# Patient Record
Sex: Female | Born: 1935 | Race: White | Hispanic: No | Smoking: Former smoker
Health system: Southern US, Community
[De-identification: ages and names within clinical notes are randomized; demographics above are authoritative.]

## PROBLEM LIST (undated history)

## (undated) ENCOUNTER — Emergency Department (HOSPITAL_COMMUNITY): Admission: EM | Payer: Medicare Other | Source: Home / Self Care

## (undated) DIAGNOSIS — I2510 Atherosclerotic heart disease of native coronary artery without angina pectoris: Secondary | ICD-10-CM

## (undated) DIAGNOSIS — R918 Other nonspecific abnormal finding of lung field: Secondary | ICD-10-CM

## (undated) DIAGNOSIS — D649 Anemia, unspecified: Secondary | ICD-10-CM

## (undated) DIAGNOSIS — I219 Acute myocardial infarction, unspecified: Secondary | ICD-10-CM

## (undated) DIAGNOSIS — I4891 Unspecified atrial fibrillation: Secondary | ICD-10-CM

## (undated) DIAGNOSIS — I10 Essential (primary) hypertension: Secondary | ICD-10-CM

## (undated) DIAGNOSIS — C3490 Malignant neoplasm of unspecified part of unspecified bronchus or lung: Secondary | ICD-10-CM

## (undated) DIAGNOSIS — J449 Chronic obstructive pulmonary disease, unspecified: Secondary | ICD-10-CM

## (undated) DIAGNOSIS — E039 Hypothyroidism, unspecified: Secondary | ICD-10-CM

## (undated) HISTORY — DX: Other nonspecific abnormal finding of lung field: R91.8

## (undated) HISTORY — DX: Atherosclerotic heart disease of native coronary artery without angina pectoris: I25.10

## (undated) HISTORY — PX: BACK SURGERY: SHX140

## (undated) HISTORY — PX: APPENDECTOMY: SHX54

## (undated) HISTORY — PX: OTHER SURGICAL HISTORY: SHX169

## (undated) HISTORY — PX: ABDOMINAL HYSTERECTOMY: SHX81

## (undated) HISTORY — DX: Malignant neoplasm of unspecified part of unspecified bronchus or lung: C34.90

## (undated) HISTORY — DX: Acute myocardial infarction, unspecified: I21.9

## (undated) HISTORY — DX: Unspecified atrial fibrillation: I48.91

## (undated) HISTORY — PX: CORONARY ANGIOPLASTY WITH STENT PLACEMENT: SHX49

## (undated) HISTORY — DX: Essential (primary) hypertension: I10

---

## 2006-01-04 ENCOUNTER — Observation Stay (HOSPITAL_COMMUNITY): Admission: EM | Admit: 2006-01-04 | Discharge: 2006-01-07 | Payer: Self-pay | Admitting: Emergency Medicine

## 2006-02-06 ENCOUNTER — Encounter (HOSPITAL_COMMUNITY): Admission: RE | Admit: 2006-02-06 | Discharge: 2006-03-08 | Payer: Self-pay | Admitting: Orthopedic Surgery

## 2006-03-13 ENCOUNTER — Encounter (HOSPITAL_COMMUNITY): Admission: RE | Admit: 2006-03-13 | Discharge: 2006-04-12 | Payer: Self-pay | Admitting: Orthopedic Surgery

## 2007-02-04 ENCOUNTER — Ambulatory Visit (HOSPITAL_COMMUNITY): Admission: RE | Admit: 2007-02-04 | Discharge: 2007-02-04 | Payer: Self-pay | Admitting: Family Medicine

## 2009-05-21 ENCOUNTER — Encounter: Payer: Self-pay | Admitting: Emergency Medicine

## 2009-05-21 ENCOUNTER — Emergency Department (HOSPITAL_COMMUNITY): Admission: EM | Admit: 2009-05-21 | Discharge: 2009-05-21 | Payer: Self-pay | Admitting: Emergency Medicine

## 2009-07-28 ENCOUNTER — Encounter (HOSPITAL_COMMUNITY): Admission: RE | Admit: 2009-07-28 | Discharge: 2009-08-27 | Payer: Self-pay | Admitting: Orthopedic Surgery

## 2009-09-09 HISTORY — PX: COLONOSCOPY: SHX174

## 2010-05-27 NOTE — Discharge Summary (Signed)
[name] [name], [name] [name]              ACCOUNT NO.:  [id number]   MEDICAL RECORD NO.:  [id number]          PATIENT TYPE:  OBV   LOCATION:  A340                          FACILITY:  [facility]   PHYSICIAN:  Marcello Moores, MD   DATE OF BIRTH:  September 25, 1927   DATE OF ADMISSION:  01/04/2006  DATE OF DISCHARGE:  12/30/2007LH                               DISCHARGE SUMMARY   PRIMARY CARE PHYSICIAN:  Dr. Marjean Donna   HISTORY OF PRESENT ILLNESS:  She is a 77 year old patient with history  of hypertension, hypothyroidism and high cholesterol, and osteoporosis.  She was admitted for multiple right-sided ribs fracture 8 11th with a  right arm fracture after a fall most probably old thanks giving day..  The patient was put on pain medication, pain management. The pain is  well improved and the patient wants to go home with pain medication and  physical therapy at home. A CT scan of the C-spine was done while she  was in the hospital which showed spondylosis and degenerative changes,  and small left upper lobe nodule with recommendation for a follow-up CT  scan after 6 months.   PHYSICAL EXAMINATION:  GENERAL: She is comfortable in bed. Her son is at  her bedside.  VITAL SIGNS: Within normal range. Blood pressure 137/70, pulse rate is  72, temperature is 98.4.  HEENT: She has pink conjunctivae and non icteric sclerae. Pupils are  equal and reactive.  CHEST: She has good air entry bilaterally, there are no abnormal sounds.  There is tenderness on the right side of the chest much improved from  that at time of admission.  CVS: S1, S2 regular, normal, no murmur.  ABDOMEN: Soft, bowel sounds positive. No organomegaly or tenderness.  EXTREMITIES: She has right arm tenderness but there is no edema.  CNS:  She is alert and well oriented.   LABORATORY DATA:  CBC is 11,000, hemoglobin is 11, hematocrit is 32, and  platelet count is 281,000. PT, INR is 13 and 1. Chemistries - blood  glucose is 103,  sodium is 132, potassium is 4, chloride is 99 and bicarb  is 29. BUN and creatinine 10 and 0.9. TSH level is 1.7.   CT scan of the C-spine reveals spondylosis and degenerative disk  changes, and a left upper lobe nodule.   ASSESSMENT AND PLAN:  1. Pain secondary to fracture is improving with pain medication.  2. Hypertension, stable on home medications.  3. Hypothyroidism, stable on home medications.  4. She needs to follow-up with her primary care physician in 2 weeks.  5. Physical therapy at home at least x3 a week for 2-3 weeks.   MEDICATIONS:  she will contuinue with her meds.  1.Hydrochlorothiazide 25 mg daily.  1. Metoprolol 25 mg b.i.d.  2. Synthroid 100 mcg p.o. daily.  3. Lipitor 20 mg p.o. daily.  4. Diovan 160 mg p.o. daily.  5. Fosamax 70 mg p.o. weekly.  7 percocet for pain  1. Primary care physician appointment in 2 weeks.  This was discussed with the patient and her son in detailand they are  aware  of fellowup imprtance.      Marcello Moores, MD  Electronically Signed     MT/MEDQ  D:  01/05/2006  T:  01/05/2006  Job:  [id number]   cc:   Marjean Donna  Fax: [phone]

## 2010-05-27 NOTE — Consult Note (Signed)
NAMEEVELIA, [name] [name]              ACCOUNT NO.:  [id number]   MEDICAL RECORD NO.:  [id number]          PATIENT TYPE:  INP   LOCATION:  A340                          FACILITY:  [facility]   PHYSICIAN:  J. Darreld Mclean, M.D. DATE OF BIRTH:  05/06/1927   DATE OF CONSULTATION:  01/04/2006  DATE OF DISCHARGE:                                 CONSULTATION   The patient fell on Thanksgiving Day and injured her right shoulder and  her side.  She was seen by Dr. Orson Slick in [place]. He was treating her  for a fracture of her right proximal humerus.  X-rays show multiple rib  fractures as well taken recently plus the healing fracture of the right  proximal humerus.  Yesterday she had severe pain.  She called her son  who came in to see her earlier this morning because of significant pain  in her back.  She denied a new fall.  Neurologically she was intact.  She has just been given some Dilaudid.  She is very drowsy.  Most of the  history is obtained from the son.   She has some pain trying to move her shoulder.  Neurovascular is intact.  Breathing is good.  Neurologically she is intact.   IMPRESSION:  1. Fractured right humerus approximately a month old.  2. Multiple rib fractures on the right, 8th through 19, probable old      from her fall a month ago.  3. Questionable compression fracture of the thoracolumbar spine.   I would like to get a total bone scan to rule out fracture and x-rays of  the thoracolumbar spine.  Bone scan can be tomorrow if I can get it  scheduled in time today for tomorrow.  Meanwhile, continue pain medicine  and bed rest.  We will follow.           ______________________________  Shela Commons. Darreld Mclean, M.D.     JWK/MEDQ  D:  01/04/2006  T:  01/04/2006  Job:  [id number]

## 2010-05-27 NOTE — H&P (Signed)
[name] [name], [name]              ACCOUNT NO.:  [id number]   MEDICAL RECORD NO.:  [id number]          PATIENT TYPE:  INP   LOCATION:  A340                          FACILITY:  [facility]   PHYSICIAN:  Marcello Moores, MD   DATE OF BIRTH:  Sep 25, 1927   DATE OF ADMISSION:  01/04/2006  DATE OF DISCHARGE:  LH                              HISTORY & PHYSICAL   PRIMARY MEDICAL DOCTOR:  Dr. Marjean Donna.   ORTHOPEDIST:  Dr. Arlean Hopping.   CHIEF COMPLAINT:  Pain on the right side of chest, one week's duration.   HISTORY OF PRESENT ILLNESS:  [name] [name] [name] is a 77 year old female patient  who had a history of hypertension, high cholesterol, hypothyroidism, and  osteoporosis.  She came to the ER with right-sided chest pain associated  with back pain for one week, nonradiating.  She gave a history of fall  and a right arm fracture on this year's Thanksgiving.  Since then, she  has pain on the same arm as well as on the chest, but it was worse over  the last week.  She was a independent woman, doing her daily activity  without any help until this Thanksgiving.  The last week she started to  have pain, which was worsened, and she was unable to do her daily  activity.  As for the son, there were no falls yesterday, except for  Thanksgiving day.   PAST MEDICAL HISTORY:  1. Hypertension, on medication.  2. High cholesterol, on medication.  3. Hypothyroidism, on Synthroid.  4. Osteoporosis, on medication.   SOCIAL HISTORY:  She is a widow since 28.  She has a history of  chronic smoking, one pack per day for several years.  Denied alcohol or  any illegal drugs.   FAMILY HISTORY:  Unremarkable.   REVIEW OF SYSTEMS:  The 10-point review of systems is unremarkable  except as noted in the HPI.   ALLERGIES:  There is no known drug allergies.   MEDICATIONS:  hydrochlorothiazide 25 mg daily  metoprolol 25 mg b.i.d.  Synthroid 100 mcg p.o. daily  Lipitor 20 mg p.o. daily  Diovan 150 mg p.o. daily  quinapril 40 mg p.o. b.i.d.  Fosamax 70 mg p.o. weekly.   PHYSICAL EXAMINATION:  GENERAL:  She is an elderly patient lying on the  bed without any respiratory distress.  VITAL SIGNS:  Blood pressure 106/48, temperature 98.6, pulse rate 60 per  minute.  She is saturating at 96% on room air.  She has pain assessment  of 5/10.  HEENT:  She has pink conjunctivae, nonicteric sclerae.  Pupils are equal  and reactive to light.  NECK:  Supple.  There is no lymphadenopathy detected.  CHEST:  Good air entry bilaterally.  There is tenderness on the right  side of the chest on palpation, but there is not any mass or swelling  detected.  CVS:  S1 and S2 well heard, regular.  There is no murmur or gallop.  ABDOMEN:  Full.  Soft.  Normoactive bowel sounds. no area of tenderness.  No mass or organomegaly appreciated.  GENITOURINARY:  No abnormality  is found.  SKIN:  Moist.  No abnormal lesions or discoloration is detected.  MUSCULOSKELETAL:  There is no tenderness or mass on the buttocks, but  there is tenderness on the right arm area.  EXTREMITIES:  There is no edema.  Palpable peripheral edema bilaterally.  CNS:  She is alert and oriented x3.  No focal deficit is appreciated.   There are no labs sent currently.  We will send it.   Chest x-ray showed multiple rib fracture on the right side from 8th to  11th.   ASSESSMENT/PLAN:  1. Multiple right-sided rib fractures, 8th to 11th, most probably old      ones related to Thanksgiving day fall.  Will give her pain      medication.  Will have physical therapy and will consult orthopedic      surgeon for advice.  2. Hypothyroidism:  She is on Synthroid and will send thyroid      stimulating hormone level and continue her synthroid.  3. Hypertension:  Well controlled with medication.  Will continue with      the same medication.  4. For high cholesterol, will continue with Lipitor and will send      fasting lipid profile tomorrow morning.  5. Will  put her on GI and DVT prophylaxis.  dicuused the issue with her son who is by the side of her and  supportive.      Marcello Moores, MD  Electronically Signed     MT/MEDQ  D:  01/04/2006  T:  01/04/2006  Job:  [id number]

## 2010-11-28 ENCOUNTER — Encounter (HOSPITAL_COMMUNITY): Admission: RE | Admit: 2010-11-28 | Payer: Medicare Other | Source: Ambulatory Visit

## 2010-11-29 ENCOUNTER — Encounter (HOSPITAL_COMMUNITY): Payer: Self-pay | Admitting: Pharmacy Technician

## 2010-12-05 ENCOUNTER — Other Ambulatory Visit: Payer: Self-pay

## 2010-12-05 ENCOUNTER — Encounter (HOSPITAL_COMMUNITY)
Admission: RE | Admit: 2010-12-05 | Discharge: 2010-12-05 | Disposition: A | Discharge status: Home / Self Care | Payer: Medicare Other | Source: Ambulatory Visit | Attending: Ophthalmology | Admitting: Ophthalmology

## 2010-12-05 ENCOUNTER — Encounter (HOSPITAL_COMMUNITY): Payer: Self-pay

## 2010-12-05 HISTORY — DX: Chronic obstructive pulmonary disease, unspecified: J44.9

## 2010-12-05 HISTORY — DX: Anemia, unspecified: D64.9

## 2010-12-05 HISTORY — DX: Hypothyroidism, unspecified: E03.9

## 2010-12-05 LAB — CBC
MCH: 31.4 pg (ref 26.0–34.0)
MCHC: 32.8 g/dL (ref 30.0–36.0)
Platelets: 253 10*3/uL (ref 150–400)
RBC: 3.34 MIL/uL — ABNORMAL LOW (ref 3.87–5.11)

## 2010-12-05 LAB — BASIC METABOLIC PANEL
BUN: 18 mg/dL (ref 6–23)
GFR calc Af Amer: 57 mL/min — ABNORMAL LOW (ref >90)
Potassium: 4.2 mEq/L (ref 3.5–5.1)

## 2010-12-05 NOTE — Patient Instructions (Addendum)
22 [name] [name]  12/05/2010   Your procedure is scheduled on:   12/06/2010  Report to [facility] at  900  AM.  Call this number if you have problems the morning of surgery: [phone]   Remember:   Do not eat food:After Midnight.  May have clear liquids:until Midnight .  Clear liquids include soda, tea, black coffee, apple or grape juice, broth.  Take these medicines the morning of surgery with A SIP OF WATER:  Levothyroxine,lopressor,benicar. Take spiriva before you come.   Do not wear jewelry, make-up or nail polish.  Do not wear lotions, powders, or perfumes. You may wear deodorant.  Do not shave 48 hours prior to surgery.  Do not bring valuables to the hospital.  Contacts, dentures or bridgework may not be worn into surgery.  Leave suitcase in the car. After surgery it may be brought to your room.  For patients admitted to the hospital, checkout time is 11:00 AM the day of discharge.   Patients discharged the day of surgery will not be allowed to drive home.  Name and phone number of your driver: family  Special Instructions: N/A   Please read over the following fact sheets that you were given: Pain Booklet, Surgical Site Infection Prevention, Anesthesia Post-op Instructions and Care and Recovery After Surgery Cataract A cataract is a clouding of the lens of the eye. It is most often related to aging. A cataract is not a "film" over the surface of the eye. The lens is inside the eye and changes size of the pupil. The lens can enlarge to let more light enter the eye in dark environments and contract the size of the pupil to let in bright light. The lens is the part of the eye that helps focus light on the retina. The retina is the eye's light-sensitive layer. It is in the back of the eye that sends visual signals to the brain. In a normal eye, light passes through the lens and gets focused on the retina. To help produce a sharp image, the lens must remain clear. When a lens becomes  cloudy, vision is compromised by the degree and nature of the clouding. Certain cataracts make people more near-sighted as they develop, others increase glare, and all reduce vision to some degree or another. A cataract that is so dense that it becomes milky white and a white opacity can be seen through the pupil. When the white color is seen, it is called a "mature" or "hyper-mature cataract." Such cataracts cause total blindness in the affected eye. The cataract must be removed to prevent damage to the eye itself. Some types of cataracts can cause a secondary disease of the eye, such as certain types of glaucoma. In the early stages, better lighting and eyeglasses may lessen vision problems caused by cataracts. At a certain point, surgery may be needed to improve vision. CAUSES   Aging. However, cataracts may occur at any age, even in newborns.   Certain drugs.   Trauma to the eye.   Certain diseases (such as diabetes).   Inherited or acquired medical syndromes.  SYMPTOMS   Gradual, progressive drop in vision in the affected eye. Cataracts may develop at different rates in each eye. Cataracts may even be in just one eye with the other unaffected.   Cataracts due to trauma may develop quickly, sometimes over a matter or days or even hours. The result is severe and rapid visual loss.  DIAGNOSIS  To detect a  cataract, an eye doctor examines the lens. A well developed cataract can be diagnosed without dilating the pupil. Early cataracts and others of a specific nature are best diagnosed with an exam of the eyes with the pupils dilated by drops. TREATMENT   For an early cataract, vision may improve by using different eyeglasses or stronger lighting.   If the above measures do not help, surgery is the only effective treatment. This treatment removes the cloudy lens and replaces it with a substitute lens (Intraocular lens, or IOL). Newly developed IOL technology allows the implanted lens to  improve vision both at a distance and up close. Discuss with your eye surgeon about the possibility of still needing glasses. Also discuss how visual coordination between both eyes will be affected.  A cataract needs to be removed only when vision loss interferes with your everyday activities such as driving, reading or watching TV. You and your eye doctor can make that decision together. In most cases, waiting until you are ready to have cataract surgery will not harm your eye. If you have cataracts in both eyes, only one should be removed at a time. This allows the operated eye to heal and be out of danger from serious problems (such as infection or poor wound healing) before having the other eye undergo surgery.  Sometimes, a cataract should be removed even if it does not cause problems with your vision. For example, a cataract should be removed if it prevents examination or treatment of another eye problem. Just as you cannot see out of the affected eye well, your doctor cannot see into your eye well through a cataract. The vast majority of people who have cataract surgery have better vision afterward. CATARACT REMOVAL There are two primary ways to remove a cataract. Your doctor can explain the differences and help determine which is best for you:  Phacoemulsification (small incision cataract surgery). This involves making a small cut (incision) on the edge of the clear, dome-shaped surface that covers the front of the eye (the cornea). An injection behind the eye or eye drops are given to make this a painless procedure. The doctor then inserts a tiny probe into the eye. This device emits ultrasound waves that soften and break up the cloudy center of the lens so it can be removed by suction. Most cataract surgery is done this way. The cuts are usually so small and performed in such a manner that often no sutures are needed to keep it closed.   Extracapsular surgery. Your doctor makes a slightly longer  incision on the side of the cornea. The doctor removes the hard center of the lens. The remainder of the lens is then removed by suction. In some cases, extremely fine sutures are needed which the doctor may, or may not remove in the office after the surgery.  When an IOL is implanted, it needs no care. It becomes a permanent part of your eye and cannot be seen or felt.  Some people cannot have an IOL. They may have problems during surgery, or maybe they have another eye disease. For these people, a soft contact lens may be suggested. If an IOL or contact lens cannot be used, very powerful and thick glasses are required after surgery. Since vision is very different through such thick glasses, it is important to have your doctor discuss the impact on your vision after any cataract surgery where there is no plan to implant an IOL. The normal lens of the eye  is covered by a clear capsule. Both phacoemulsification and extracapsular surgery require that the back surface of this lens capsule be left in place. This helps support IOLs and prevents the IOL from dislocating and falling back into the deeper interior of the eye. Right after surgery, and often permanently this "posterior capsule" remains clear. In some cases however, it can become cloudy, presenting the same type of visual compromise that the original cataract did since light is again obstructed as it passes through the clear IOL. This condition is often referred to as an "after-cataract." Fortunately, after-cataracts are easily treated using a painless and very fast laser treatment that is performed without anesthesia or incisions. It is done in a matter of minutes in an outpatient environment. Visual improvement is often immediate.  HOME CARE INSTRUCTIONS   Your surgeon will discuss pre and post operative care with you prior to surgery. The majority of people are able to do almost all normal activities right away. Although, it is often advised to avoid  strenuous activity for a period of time.   Postoperative drops and careful avoidance of infection will be needed. Many surgeons suggest the use of a protective shield during the first few days after surgery.   There is a very small incidence of complication from modern cataract surgery, but it can happen. Infection that spreads to the inside of the eye (endophthalmitis) can result in total visual loss and even loss of the eye itself. In extremely rare instances, the inflammation of endophthalmitis can spread to both eyes (sympathetic ophthalmia). Appropriate post-operative care under the close observation of your surgeon is essential to a successful outcome.  SEEK IMMEDIATE MEDICAL CARE IF:   You have any sudden drop of vision in the operated eye.   You have pain in the operated eye.   You see a large number of floating dots in the field of vision in the operated eye.   You see flashing lights, or if a portion of your side vision in any direction appears black (like a curtain being drawn into your field of vision) in the operated eye.  Document Released: 12/26/2004 Document Revised: 09/07/2010 Document Reviewed: 02/11/2007 [facility] Patient Information 2012 [place], [place].PATIENT INSTRUCTIONS POST-ANESTHESIA  IMMEDIATELY FOLLOWING SURGERY:  Do not drive or operate machinery for the first twenty four hours after surgery.  Do not make any important decisions for twenty four hours after surgery or while taking narcotic pain medications or sedatives.  If you develop intractable nausea and vomiting or a severe headache please notify your doctor immediately.  FOLLOW-UP:  Please make an appointment with your surgeon as instructed. You do not need to follow up with anesthesia unless specifically instructed to do so.  WOUND CARE INSTRUCTIONS (if applicable):  Keep a dry clean dressing on the anesthesia/puncture wound site if there is drainage.  Once the wound has quit draining you may leave it open to  air.  Generally you should leave the bandage intact for twenty four hours unless there is drainage.  If the epidural site drains for more than 36-48 hours please call the anesthesia department.  QUESTIONS?:  Please feel free to call your physician or the hospital operator if you have any questions, and they will be happy to assist you.     [facility] Anesthesia Department [address] [place] [phone]

## 2010-12-06 ENCOUNTER — Encounter (HOSPITAL_COMMUNITY): Payer: Self-pay | Admitting: Anesthesiology

## 2010-12-06 ENCOUNTER — Encounter (HOSPITAL_COMMUNITY): Payer: Self-pay | Admitting: *Deleted

## 2010-12-06 ENCOUNTER — Ambulatory Visit (HOSPITAL_COMMUNITY)
Admission: RE | Admit: 2010-12-06 | Discharge: 2010-12-06 | Disposition: A | Discharge status: Home / Self Care | Payer: Medicare Other | Source: Ambulatory Visit | Attending: Ophthalmology | Admitting: Ophthalmology

## 2010-12-06 ENCOUNTER — Encounter (HOSPITAL_COMMUNITY)
Admission: RE | Disposition: A | Discharge status: Home / Self Care | Payer: Self-pay | Source: Ambulatory Visit | Attending: Ophthalmology

## 2010-12-06 ENCOUNTER — Ambulatory Visit (HOSPITAL_COMMUNITY): Payer: Medicare Other | Admitting: Anesthesiology

## 2010-12-06 DIAGNOSIS — J4489 Other specified chronic obstructive pulmonary disease: Secondary | ICD-10-CM | POA: Insufficient documentation

## 2010-12-06 DIAGNOSIS — Z79899 Other long term (current) drug therapy: Secondary | ICD-10-CM | POA: Insufficient documentation

## 2010-12-06 DIAGNOSIS — H2510 Age-related nuclear cataract, unspecified eye: Secondary | ICD-10-CM | POA: Insufficient documentation

## 2010-12-06 DIAGNOSIS — Z01810 Encounter for preprocedural cardiovascular examination: Secondary | ICD-10-CM | POA: Insufficient documentation

## 2010-12-06 DIAGNOSIS — I10 Essential (primary) hypertension: Secondary | ICD-10-CM | POA: Insufficient documentation

## 2010-12-06 DIAGNOSIS — J449 Chronic obstructive pulmonary disease, unspecified: Secondary | ICD-10-CM | POA: Insufficient documentation

## 2010-12-06 DIAGNOSIS — Z01812 Encounter for preprocedural laboratory examination: Secondary | ICD-10-CM | POA: Insufficient documentation

## 2010-12-06 HISTORY — PX: CATARACT EXTRACTION W/PHACO: SHX586

## 2010-12-06 SURGERY — PHACOEMULSIFICATION, CATARACT, WITH IOL INSERTION
Anesthesia: Monitor Anesthesia Care | Site: Eye | Laterality: Left | Wound class: Clean

## 2010-12-06 MED ORDER — PROVISC 10 MG/ML IO SOLN
INTRAOCULAR | Status: DC | PRN
  Administered 2010-12-06: 8.5 mg via INTRAOCULAR

## 2010-12-06 MED ORDER — MIDAZOLAM HCL 2 MG/2ML IJ SOLN
1.0000 mg | INTRAMUSCULAR | Status: DC | PRN
  Administered 2010-12-06: 2 mg via INTRAVENOUS

## 2010-12-06 MED ORDER — PHENYLEPHRINE HCL 2.5 % OP SOLN
OPHTHALMIC | Status: AC
  Administered 2010-12-06: 1 [drp] via OPHTHALMIC
  Filled 2010-12-06: qty 2

## 2010-12-06 MED ORDER — ONDANSETRON HCL 4 MG/2ML IJ SOLN: 4.0000 mg | Freq: Once | INTRAMUSCULAR | Status: DC | PRN

## 2010-12-06 MED ORDER — EPINEPHRINE HCL 1 MG/ML IJ SOLN
INTRAOCULAR | Status: DC | PRN
  Administered 2010-12-06: 10:00:00

## 2010-12-06 MED ORDER — CYCLOPENTOLATE-PHENYLEPHRINE 0.2-1 % OP SOLN
1.0000 [drp] | OPHTHALMIC | Status: AC
  Administered 2010-12-06 (×3): 1 [drp] via OPHTHALMIC

## 2010-12-06 MED ORDER — CYCLOPENTOLATE-PHENYLEPHRINE 0.2-1 % OP SOLN
OPHTHALMIC | Status: AC
  Administered 2010-12-06: 1 [drp] via OPHTHALMIC
  Filled 2010-12-06: qty 2

## 2010-12-06 MED ORDER — TETRACAINE HCL 0.5 % OP SOLN
1.0000 [drp] | OPHTHALMIC | Status: AC
  Administered 2010-12-06 (×3): 1 [drp] via OPHTHALMIC

## 2010-12-06 MED ORDER — PHENYLEPHRINE HCL 2.5 % OP SOLN
1.0000 [drp] | OPHTHALMIC | Status: AC
  Administered 2010-12-06 (×3): 1 [drp] via OPHTHALMIC

## 2010-12-06 MED ORDER — TETRACAINE HCL 0.5 % OP SOLN
OPHTHALMIC | Status: AC
  Administered 2010-12-06: 1 [drp] via OPHTHALMIC
  Filled 2010-12-06: qty 2

## 2010-12-06 MED ORDER — MIDAZOLAM HCL 2 MG/2ML IJ SOLN
INTRAMUSCULAR | Status: AC
  Administered 2010-12-06: 2 mg via INTRAVENOUS
  Filled 2010-12-06: qty 2

## 2010-12-06 MED ORDER — FLURBIPROFEN SODIUM 0.03 % OP SOLN
1.0000 [drp] | OPHTHALMIC | Status: AC
  Administered 2010-12-06 (×3): 1 [drp] via OPHTHALMIC

## 2010-12-06 MED ORDER — BSS IO SOLN
INTRAOCULAR | Status: DC | PRN
  Administered 2010-12-06: 15 mL via INTRAOCULAR

## 2010-12-06 MED ORDER — FENTANYL CITRATE 0.05 MG/ML IJ SOLN: 25.0000 ug | INTRAMUSCULAR | Status: DC | PRN

## 2010-12-06 MED ORDER — LACTATED RINGERS IV SOLN
INTRAVENOUS | Status: DC
  Administered 2010-12-06: 1000 mL via INTRAVENOUS

## 2010-12-06 MED ORDER — FLURBIPROFEN SODIUM 0.03 % OP SOLN
OPHTHALMIC | Status: AC
  Administered 2010-12-06: 1 [drp] via OPHTHALMIC
  Filled 2010-12-06: qty 2.5

## 2010-12-06 SURGICAL SUPPLY — 21 items
CAPSULAR TENSION RING-AMO (OPHTHALMIC RELATED) IMPLANT
CLOTH BEACON ORANGE TIMEOUT ST (SAFETY) ×2 IMPLANT
EYE SHIELD UNIVERSAL CLEAR (GAUZE/BANDAGES/DRESSINGS) ×2 IMPLANT
GLOVE BIOGEL M 6.5 STRL (GLOVE) ×2 IMPLANT
GLOVE ECLIPSE 6.5 STRL STRAW (GLOVE) IMPLANT
GLOVE ECLIPSE 7.0 STRL STRAW (GLOVE) IMPLANT
GLOVE EXAM NITRILE LRG STRL (GLOVE) ×2 IMPLANT
GLOVE EXAM NITRILE MD LF STRL (GLOVE) IMPLANT
GLOVE SKINSENSE NS SZ6.5 (GLOVE)
GLOVE SKINSENSE STRL SZ6.5 (GLOVE) IMPLANT
HEALON 5 0.6 ML (INTRAOCULAR LENS) IMPLANT
KIT VITRECTOMY (OPHTHALMIC RELATED) IMPLANT
PAD ARMBOARD 7.5X6 YLW CONV (MISCELLANEOUS) ×2 IMPLANT
PROC W NO LENS (INTRAOCULAR LENS)
PROC W SPEC LENS (INTRAOCULAR LENS)
PROCESS W NO LENS (INTRAOCULAR LENS) IMPLANT
PROCESS W SPEC LENS (INTRAOCULAR LENS) IMPLANT
RING MALYGIN (MISCELLANEOUS) IMPLANT
SIGHTPATH CAT PROC W REG LENS (Ophthalmic Related) ×2 IMPLANT
VISCOELASTIC ADDITIONAL (OPHTHALMIC RELATED) IMPLANT
WATER STERILE IRR 250ML POUR (IV SOLUTION) ×2 IMPLANT

## 2010-12-06 NOTE — Op Note (Signed)
Patient brought to the operating room and prepped and draped in the usual manner.  Lid speculum inserted in right eye.  Stab incision made at the twelve o'clock position.  Provisc instilled in the anterior chamber.   A 2.4 mm. Stab incision was made temporally.  An anterior capsulotomy was done with a bent 25 gauge needle.  The nucleus was hydrodissected.  The Phaco tip was inserted in the anterior chamber and the nucleus was emulsified.  CDE was 10.81.  The cortical material was then removed with the I and A tip.  Posterior capsule was the polished.  The anterior chamber was deepened with Provisc.  A 22.0 Diopter Rayner 570C IOL was then inserted in the capsular bag.  Provisc was then removed with the I and A tip.  The wound was then hydrated.  Patient sent to the Recovery Room in good condition with follow up in my office.

## 2010-12-06 NOTE — Transfer of Care (Signed)
Immediate Anesthesia Transfer of Care Note  Patient: [name] [name]  Procedure(s) Performed:  CATARACT EXTRACTION PHACO AND INTRAOCULAR LENS PLACEMENT (IOC) - CDE:10.81  Patient Location: PACU and Short Stay  Anesthesia Type: MAC  Level of Consciousness: awake, alert , oriented and patient cooperative  Airway & Oxygen Therapy: Patient Spontanous Breathing  Post-op Assessment: Report given to PACU RN, Post -op Vital signs reviewed and stable and Patient moving all extremities  Post vital signs: Reviewed and stable  Complications: No apparent anesthesia complications

## 2010-12-06 NOTE — Anesthesia Preprocedure Evaluation (Addendum)
Anesthesia Evaluation  Patient identified by MRN, date of birth, ID band Patient awake    Reviewed: Allergy & Precautions, H&P , NPO status , Patient's Chart, lab work & pertinent test results, reviewed documented beta blocker date and time   History of Anesthesia Complications Negative for: history of anesthetic complications  Airway Mallampati: III    Mouth opening: Limited Mouth Opening  Dental  (+) Edentulous Upper and Edentulous Lower   Pulmonary shortness of breath and with exertion, COPD (Lung Cancer - R Mid Lobectomy)   Pulmonary exam normal       Cardiovascular hypertension, + Past MI and + Cardiac Stents Regular Normal    Neuro/Psych    GI/Hepatic   Endo/Other    Renal/GU      Musculoskeletal   Abdominal   Peds  Hematology   Anesthesia Other Findings   Reproductive/Obstetrics                           Anesthesia Physical Anesthesia Plan  ASA: III  Anesthesia Plan: MAC   Post-op Pain Management:    Induction: Intravenous  Airway Management Planned: Nasal Cannula  Additional Equipment:   Intra-op Plan:   Post-operative Plan:   Informed Consent: I have reviewed the patients History and Physical, chart, labs and discussed the procedure including the risks, benefits and alternatives for the proposed anesthesia with the patient or authorized representative who has indicated his/her understanding and acceptance.     Plan Discussed with:   Anesthesia Plan Comments:        Anesthesia Quick Evaluation

## 2010-12-06 NOTE — Anesthesia Postprocedure Evaluation (Signed)
  Anesthesia Post-op Note  Patient: [name] [name]  Procedure(s) Performed:  CATARACT EXTRACTION PHACO AND INTRAOCULAR LENS PLACEMENT (IOC) - CDE:10.81  Patient Location: PACU and Short Stay  Anesthesia Type: MAC  Level of Consciousness: awake, alert , oriented and patient cooperative  Airway and Oxygen Therapy: Patient Spontanous Breathing  Post-op Pain: none  Post-op Assessment: Post-op Vital signs reviewed, Patient's Cardiovascular Status Stable, Respiratory Function Stable and Patent Airway  Post-op Vital Signs: Reviewed and stable  Complications: No apparent anesthesia complications

## 2010-12-06 NOTE — Discharge Instructions (Signed)
ERCEL NORMOYLE  12/06/2010           [facility] Instructions [address] [place] [address]      1. Avoid closing eyes tightly. One often closes the eye tightly when laughing, talking, sneezing, coughing or if they feel irritated. At these times, you should be careful not to close your eyes tightly.  2. Instill one drop [place] in operative eye 3 times each day until the bottle is finished. To instill drops in your eye, open it, look up and have someone gently pull the lower lid down and instill a couple of drops inside the lower lid.  3. Do not touch upper lid.  4. Take Advil or Tylenol for pain.  5. You may use either eye for near work, such as reading or sewing and you may watch television.  6. You may have your hair done at the beauty parlor at any time.  7. Wear dark glasses with or without your own glasses if you are in bright light.  8. Call our office at [phone] or [phone] if you have sharp pain in your eye or unusual symptoms.  9. Do not be concerned because vision in the operative eye is not good. It will not be good, no matter how successful the operation, until you get a special lens for it. Your old glasses will not be suited to the new eye that was operated on and you will not be ready for a new lens for about a month.  10. Follow up at the [facility] office.    I have received a copy of the above instructions and will follow them.

## 2010-12-06 NOTE — H&P (Signed)
The patient was re examined and there is no change in the patients condition since the original H and P. 

## 2010-12-13 ENCOUNTER — Encounter (HOSPITAL_COMMUNITY): Payer: Self-pay | Admitting: Ophthalmology

## 2010-12-14 ENCOUNTER — Encounter (HOSPITAL_COMMUNITY): Payer: Self-pay

## 2010-12-15 ENCOUNTER — Encounter (HOSPITAL_COMMUNITY)
Admission: RE | Admit: 2010-12-15 | Discharge: 2010-12-15 | Payer: Medicare Other | Source: Ambulatory Visit | Admitting: Ophthalmology

## 2010-12-20 ENCOUNTER — Encounter (HOSPITAL_COMMUNITY)
Admission: RE | Disposition: A | Discharge status: Home / Self Care | Payer: Self-pay | Source: Ambulatory Visit | Attending: Ophthalmology

## 2010-12-20 ENCOUNTER — Encounter (HOSPITAL_COMMUNITY): Payer: Self-pay | Admitting: Anesthesiology

## 2010-12-20 ENCOUNTER — Encounter (HOSPITAL_COMMUNITY): Payer: Self-pay

## 2010-12-20 ENCOUNTER — Ambulatory Visit (HOSPITAL_COMMUNITY)
Admission: RE | Admit: 2010-12-20 | Discharge: 2010-12-20 | Disposition: A | Discharge status: Home / Self Care | Payer: Medicare Other | Source: Ambulatory Visit | Attending: Ophthalmology | Admitting: Ophthalmology

## 2010-12-20 ENCOUNTER — Ambulatory Visit (HOSPITAL_COMMUNITY): Payer: Medicare Other | Admitting: Anesthesiology

## 2010-12-20 DIAGNOSIS — H2510 Age-related nuclear cataract, unspecified eye: Secondary | ICD-10-CM | POA: Insufficient documentation

## 2010-12-20 DIAGNOSIS — J4489 Other specified chronic obstructive pulmonary disease: Secondary | ICD-10-CM | POA: Insufficient documentation

## 2010-12-20 DIAGNOSIS — Z79899 Other long term (current) drug therapy: Secondary | ICD-10-CM | POA: Insufficient documentation

## 2010-12-20 DIAGNOSIS — I10 Essential (primary) hypertension: Secondary | ICD-10-CM | POA: Insufficient documentation

## 2010-12-20 DIAGNOSIS — Z7982 Long term (current) use of aspirin: Secondary | ICD-10-CM | POA: Insufficient documentation

## 2010-12-20 DIAGNOSIS — J449 Chronic obstructive pulmonary disease, unspecified: Secondary | ICD-10-CM | POA: Insufficient documentation

## 2010-12-20 HISTORY — PX: CATARACT EXTRACTION W/PHACO: SHX586

## 2010-12-20 SURGERY — PHACOEMULSIFICATION, CATARACT, WITH IOL INSERTION
Anesthesia: Monitor Anesthesia Care | Site: Eye | Laterality: Right | Wound class: Clean

## 2010-12-20 MED ORDER — EPINEPHRINE HCL 1 MG/ML IJ SOLN
INTRAOCULAR | Status: DC | PRN
  Administered 2010-12-20: 08:00:00

## 2010-12-20 MED ORDER — FLURBIPROFEN SODIUM 0.03 % OP SOLN
OPHTHALMIC | Status: AC
  Administered 2010-12-20: 1 [drp] via OPHTHALMIC
  Filled 2010-12-20: qty 2.5

## 2010-12-20 MED ORDER — BSS IO SOLN
INTRAOCULAR | Status: DC | PRN
  Administered 2010-12-20: 15 mL via INTRAOCULAR

## 2010-12-20 MED ORDER — TETRACAINE HCL 0.5 % OP SOLN
OPHTHALMIC | Status: AC
  Administered 2010-12-20: 1 [drp] via OPHTHALMIC
  Filled 2010-12-20: qty 2

## 2010-12-20 MED ORDER — PHENYLEPHRINE HCL 2.5 % OP SOLN
1.0000 [drp] | OPHTHALMIC | Status: AC
  Administered 2010-12-20 (×3): 1 [drp] via OPHTHALMIC

## 2010-12-20 MED ORDER — TETRACAINE HCL 0.5 % OP SOLN
1.0000 [drp] | OPHTHALMIC | Status: AC
  Administered 2010-12-20 (×3): 1 [drp] via OPHTHALMIC

## 2010-12-20 MED ORDER — PHENYLEPHRINE HCL 2.5 % OP SOLN
OPHTHALMIC | Status: AC
  Administered 2010-12-20: 1 [drp] via OPHTHALMIC
  Filled 2010-12-20: qty 2

## 2010-12-20 MED ORDER — LACTATED RINGERS IV SOLN
INTRAVENOUS | Status: DC
  Administered 2010-12-20: 07:00:00 via INTRAVENOUS

## 2010-12-20 MED ORDER — MIDAZOLAM HCL 2 MG/2ML IJ SOLN
INTRAMUSCULAR | Status: AC
  Administered 2010-12-20: 2 mg via INTRAVENOUS
  Filled 2010-12-20: qty 2

## 2010-12-20 MED ORDER — KETOROLAC TROMETHAMINE 0.5 % OP SOLN
1.0000 [drp] | OPHTHALMIC | Status: AC
  Administered 2010-12-20 (×3): 1 [drp] via OPHTHALMIC

## 2010-12-20 MED ORDER — CYCLOPENTOLATE-PHENYLEPHRINE 0.2-1 % OP SOLN
1.0000 [drp] | OPHTHALMIC | Status: AC
  Administered 2010-12-20 (×3): 1 [drp] via OPHTHALMIC

## 2010-12-20 MED ORDER — FLURBIPROFEN SODIUM 0.03 % OP SOLN
1.0000 [drp] | OPHTHALMIC | Status: AC
  Administered 2010-12-20 (×3): 1 [drp] via OPHTHALMIC

## 2010-12-20 MED ORDER — MIDAZOLAM HCL 2 MG/2ML IJ SOLN
1.0000 mg | INTRAMUSCULAR | Status: DC | PRN
  Administered 2010-12-20: 2 mg via INTRAVENOUS

## 2010-12-20 MED ORDER — CYCLOPENTOLATE-PHENYLEPHRINE 0.2-1 % OP SOLN
OPHTHALMIC | Status: AC
  Administered 2010-12-20: 1 [drp] via OPHTHALMIC
  Filled 2010-12-20: qty 2

## 2010-12-20 MED ORDER — EPINEPHRINE HCL 1 MG/ML IJ SOLN
INTRAMUSCULAR | Status: AC
  Filled 2010-12-20: qty 1

## 2010-12-20 MED ORDER — PROVISC 10 MG/ML IO SOLN
INTRAOCULAR | Status: DC | PRN
  Administered 2010-12-20: 8.5 mg via INTRAOCULAR

## 2010-12-20 MED ORDER — KETOROLAC TROMETHAMINE 0.5 % OP SOLN
OPHTHALMIC | Status: AC
  Administered 2010-12-20: 1 [drp] via OPHTHALMIC
  Filled 2010-12-20: qty 5

## 2010-12-20 SURGICAL SUPPLY — 21 items
CAPSULAR TENSION RING-AMO (OPHTHALMIC RELATED) IMPLANT
CLOTH BEACON ORANGE TIMEOUT ST (SAFETY) ×2 IMPLANT
EYE SHIELD UNIVERSAL CLEAR (GAUZE/BANDAGES/DRESSINGS) ×2 IMPLANT
GLOVE BIOGEL M 6.5 STRL (GLOVE) IMPLANT
GLOVE ECLIPSE 6.5 STRL STRAW (GLOVE) IMPLANT
GLOVE ECLIPSE 7.0 STRL STRAW (GLOVE) IMPLANT
GLOVE EXAM NITRILE LRG STRL (GLOVE) ×2 IMPLANT
GLOVE EXAM NITRILE MD LF STRL (GLOVE) IMPLANT
GLOVE SKINSENSE NS SZ6.5 (GLOVE)
GLOVE SKINSENSE STRL SZ6.5 (GLOVE) IMPLANT
HEALON 5 0.6 ML (INTRAOCULAR LENS) IMPLANT
KIT VITRECTOMY (OPHTHALMIC RELATED) IMPLANT
PAD ARMBOARD 7.5X6 YLW CONV (MISCELLANEOUS) ×2 IMPLANT
PROC W NO LENS (INTRAOCULAR LENS)
PROC W SPEC LENS (INTRAOCULAR LENS)
PROCESS W NO LENS (INTRAOCULAR LENS) IMPLANT
PROCESS W SPEC LENS (INTRAOCULAR LENS) IMPLANT
RING MALYGIN (MISCELLANEOUS) IMPLANT
SIGHTPATH CAT PROC W REG LENS (Ophthalmic Related) ×2 IMPLANT
VISCOELASTIC ADDITIONAL (OPHTHALMIC RELATED) IMPLANT
WATER STERILE IRR 250ML POUR (IV SOLUTION) ×2 IMPLANT

## 2010-12-20 NOTE — Transfer of Care (Signed)
Immediate Anesthesia Transfer of Care Note  Patient: [name] [name]  Procedure(s) Performed:  CATARACT EXTRACTION PHACO AND INTRAOCULAR LENS PLACEMENT (IOC) - CDI:7.94  Patient Location: Shortstay  Anesthesia Type: MAC  Level of Consciousness: awake  Airway & Oxygen Therapy: Patient Spontanous Breathing   Post-op Assessment: Report given to PACU RN, Post -op Vital signs reviewed and stable and Patient moving all extremities  Post vital signs: Reviewed and stable  Complications: No apparent anesthesia complications

## 2010-12-20 NOTE — Anesthesia Procedure Notes (Signed)
Procedure Name: MAC Date/Time: 12/20/2010 7:25 AM Performed by: Minerva Areola Pre-anesthesia Checklist: Patient identified, Patient being monitored, Emergency Drugs available, Timeout performed and Suction available Patient Re-evaluated:Patient Re-evaluated prior to inductionOxygen Delivery Method: Nasal Cannula

## 2010-12-20 NOTE — Anesthesia Postprocedure Evaluation (Signed)
  Anesthesia Post-op Note  Patient: [name] [name]  Procedure(s) Performed:  CATARACT EXTRACTION PHACO AND INTRAOCULAR LENS PLACEMENT (IOC) - CDI:7.94  Patient Location:  Short Stay  Anesthesia Type: MAC  Level of Consciousness: awake  Airway and Oxygen Therapy: Patient Spontanous Breathing  Post-op Pain: none  Post-op Assessment: Post-op Vital signs reviewed, Patient's Cardiovascular Status Stable, Respiratory Function Stable, Patent Airway, No signs of Nausea or vomiting and Pain level controlled  Post-op Vital Signs: Reviewed and stable  Complications: No apparent anesthesia complications

## 2010-12-20 NOTE — Anesthesia Preprocedure Evaluation (Signed)
Anesthesia Evaluation  Patient identified by MRN, date of birth, ID band Patient awake    Reviewed: Allergy & Precautions, H&P , NPO status , Patient's Chart, lab work & pertinent test results, reviewed documented beta blocker date and time   History of Anesthesia Complications Negative for: history of anesthetic complications  Airway Mallampati: III    Mouth opening: Limited Mouth Opening  Dental  (+) Edentulous Upper and Edentulous Lower   Pulmonary shortness of breath and with exertion, COPD (Lung Cancer - R Mid Lobectomy)   Pulmonary exam normal       Cardiovascular hypertension, + Past MI and + Cardiac Stents Regular Normal    Neuro/Psych    GI/Hepatic   Endo/Other    Renal/GU      Musculoskeletal   Abdominal   Peds  Hematology   Anesthesia Other Findings   Reproductive/Obstetrics                           Anesthesia Physical Anesthesia Plan  ASA: III  Anesthesia Plan: MAC   Post-op Pain Management:    Induction: Intravenous  Airway Management Planned: Nasal Cannula  Additional Equipment:   Intra-op Plan:   Post-operative Plan:   Informed Consent: I have reviewed the patients History and Physical, chart, labs and discussed the procedure including the risks, benefits and alternatives for the proposed anesthesia with the patient or authorized representative who has indicated his/her understanding and acceptance.     Plan Discussed with:   Anesthesia Plan Comments:        Anesthesia Quick Evaluation  

## 2010-12-20 NOTE — Op Note (Signed)
Patient brought to the operating room and prepped and draped in the usual manner.  Lid speculum inserted in right eye.  Stab incision made at the twelve o'clock position.  Provisc instilled in the anterior chamber.   A 2.4 mm. Stab incision was made temporally.  An anterior capsulotomy was done with a bent 25 gauge needle.  The nucleus was hydrodissected.  The Phaco tip was inserted in the anterior chamber and the nucleus was emulsified.  CDE was 7.94.  The cortical material was then removed with the I and A tip.  Posterior capsule was the polished.  The anterior chamber was deepened with Provisc.  A 23.5 Diopter Rayner 570C IOL was then inserted in the capsular bag.  Provisc was then removed with the I and A tip.  The wound was then hydrated.  Patient sent to the Recovery Room in good condition with follow up in my office.

## 2010-12-20 NOTE — H&P (Signed)
The patient was re examined and there is no change in the patients condition since the original H and P. 

## 2010-12-26 ENCOUNTER — Encounter (HOSPITAL_COMMUNITY): Payer: Self-pay | Admitting: Ophthalmology

## 2010-12-29 ENCOUNTER — Encounter (INDEPENDENT_AMBULATORY_CARE_PROVIDER_SITE_OTHER): Payer: Medicare Other | Admitting: Ophthalmology

## 2010-12-29 DIAGNOSIS — H43819 Vitreous degeneration, unspecified eye: Secondary | ICD-10-CM

## 2010-12-29 DIAGNOSIS — H35379 Puckering of macula, unspecified eye: Secondary | ICD-10-CM

## 2010-12-29 DIAGNOSIS — H35359 Cystoid macular degeneration, unspecified eye: Secondary | ICD-10-CM

## 2010-12-29 DIAGNOSIS — H35039 Hypertensive retinopathy, unspecified eye: Secondary | ICD-10-CM

## 2010-12-29 DIAGNOSIS — I10 Essential (primary) hypertension: Secondary | ICD-10-CM

## 2011-02-09 ENCOUNTER — Encounter (INDEPENDENT_AMBULATORY_CARE_PROVIDER_SITE_OTHER): Payer: Medicare Other | Admitting: Ophthalmology

## 2011-02-09 DIAGNOSIS — H35359 Cystoid macular degeneration, unspecified eye: Secondary | ICD-10-CM

## 2011-02-09 DIAGNOSIS — H35039 Hypertensive retinopathy, unspecified eye: Secondary | ICD-10-CM

## 2011-02-09 DIAGNOSIS — H43819 Vitreous degeneration, unspecified eye: Secondary | ICD-10-CM

## 2011-02-09 DIAGNOSIS — H26499 Other secondary cataract, unspecified eye: Secondary | ICD-10-CM

## 2011-02-09 DIAGNOSIS — H35379 Puckering of macula, unspecified eye: Secondary | ICD-10-CM

## 2011-02-09 DIAGNOSIS — I10 Essential (primary) hypertension: Secondary | ICD-10-CM

## 2011-03-22 ENCOUNTER — Encounter (INDEPENDENT_AMBULATORY_CARE_PROVIDER_SITE_OTHER): Payer: Medicare Other | Admitting: Ophthalmology

## 2011-03-22 DIAGNOSIS — H43819 Vitreous degeneration, unspecified eye: Secondary | ICD-10-CM

## 2011-03-22 DIAGNOSIS — H3530 Unspecified macular degeneration: Secondary | ICD-10-CM

## 2011-03-22 DIAGNOSIS — H35379 Puckering of macula, unspecified eye: Secondary | ICD-10-CM

## 2011-03-22 DIAGNOSIS — H4310 Vitreous hemorrhage, unspecified eye: Secondary | ICD-10-CM

## 2011-03-22 DIAGNOSIS — H35359 Cystoid macular degeneration, unspecified eye: Secondary | ICD-10-CM

## 2011-03-22 DIAGNOSIS — I10 Essential (primary) hypertension: Secondary | ICD-10-CM

## 2011-03-22 DIAGNOSIS — H35039 Hypertensive retinopathy, unspecified eye: Secondary | ICD-10-CM

## 2011-09-22 ENCOUNTER — Ambulatory Visit (INDEPENDENT_AMBULATORY_CARE_PROVIDER_SITE_OTHER): Payer: Medicare Other | Admitting: Ophthalmology

## 2011-10-03 ENCOUNTER — Encounter: Payer: Self-pay | Admitting: Cardiology

## 2011-10-05 ENCOUNTER — Encounter: Payer: Self-pay | Admitting: *Deleted

## 2011-10-06 ENCOUNTER — Ambulatory Visit (INDEPENDENT_AMBULATORY_CARE_PROVIDER_SITE_OTHER): Payer: Medicare Other | Admitting: Cardiology

## 2011-10-06 ENCOUNTER — Encounter: Payer: Self-pay | Admitting: Cardiology

## 2011-10-06 VITALS — BP 166/82 | HR 53 | Ht 59.0 in | Wt 144.0 lb

## 2011-10-06 DIAGNOSIS — E782 Mixed hyperlipidemia: Secondary | ICD-10-CM

## 2011-10-06 DIAGNOSIS — I2510 Atherosclerotic heart disease of native coronary artery without angina pectoris: Secondary | ICD-10-CM

## 2011-10-06 DIAGNOSIS — I10 Essential (primary) hypertension: Secondary | ICD-10-CM | POA: Insufficient documentation

## 2011-10-06 DIAGNOSIS — I4891 Unspecified atrial fibrillation: Secondary | ICD-10-CM | POA: Insufficient documentation

## 2011-10-06 NOTE — Assessment & Plan Note (Signed)
Postoperative with last lung surgery. Will follow for now - in sinus rhythm. We did discuss thromboembolic risk, CHADS2 of 2. She prefers to stay on ASA.

## 2011-10-06 NOTE — Patient Instructions (Addendum)
Your physician recommends that you schedule a follow-up appointment in: 6 months  

## 2011-10-06 NOTE — Assessment & Plan Note (Signed)
Continue statin therapy. Due for labwork soon, can review lipids when obtained. Goal LDL should be close to 70.

## 2011-10-06 NOTE — Assessment & Plan Note (Signed)
Blood pressure elevated today. She checks it at home and states systolic usually in 120-130 range. I asked her to keep log for her next visit. Could have element of white coat hypertension.

## 2011-10-06 NOTE — Assessment & Plan Note (Signed)
Symptomatically stable on medical therapy. Records reviewed. ECG reviewed today. No change to current regimen. Had negative stress test last year. Followup arranged.

## 2011-10-06 NOTE — Progress Notes (Signed)
Clinical Summary [name] [name] is a 78 y.o.female referred by Dr. Egbert [name] for a new patient visit, to establish cardiology followup. Available records reviewed. She is here with her son.   She reports no angina or change in baseline dyspnea. She uses supplemental oxygen at night. Functionally limited with leg pain and uses a walker. No falls.  Dr. Albertina [name] notes indicate a negative dobutamine echocardiogram in February 2012.Marland Kitchen Her ECG today shows sinus bradycardia with PAC and borderline low voltage.  She was on Amiodarone and Coumadin for limited time after atrial fibrillation that occurred at the time of her last lung surgery. She has had no obvious recurrence since then. We discussed thromboembolic risk and she prefers to stay on ASA for now. We can discuss this over time, particularly if she has further arrhythmia.  She is due to have full set a labwork soon with physical.   No Known Allergies  Current Outpatient Prescriptions  Medication Sig Dispense Refill  . acetaminophen (TYLENOL) 500 MG tablet Take 500 mg by mouth every 6 (six) hours as needed. For pain       . aspirin EC 81 MG tablet Take 81 mg by mouth daily.        Marland Kitchen atorvastatin (LIPITOR) 20 MG tablet Take 10 mg by mouth daily.       . ferrous sulfate 325 (65 FE) MG tablet Take 325 mg by mouth daily with breakfast.        . hydrochlorothiazide (HYDRODIURIL) 12.5 MG tablet Take 12.5 mg by mouth daily.        Marland Kitchen levothyroxine (SYNTHROID, LEVOTHROID) 88 MCG tablet Take 88 mcg by mouth daily.        . metoprolol tartrate (LOPRESSOR) 25 MG tablet Take 12.5 mg by mouth daily.       Marland Kitchen olmesartan (BENICAR) 40 MG tablet Take 40 mg by mouth daily.        Marland Kitchen tiotropium (SPIRIVA) 18 MCG inhalation capsule Place 18 mcg into inhaler and inhale daily.          Past Medical History  Diagnosis Date  . Essential hypertension, benign   . Coronary atherosclerosis of native coronary artery     Previously followed by Dr. Earna [name], BMS RCA 2002  .  Hypothyroidism   . Anemia   . COPD (chronic obstructive pulmonary disease)   . Lung cancer     Adenocarcinoma and carcinoid tumor  . Atrial fibrillation     Perioperative  . Pulmonary nodules   . MI (myocardial infarction)     IMI with TNK then PCI 2002    Past Surgical History  Procedure Date  . Abdominal hysterectomy   . Right middle lobe wedge resection     2012  . Right upper lobectomy     2008  . Back surgery     laminectomy  . Appendectomy   . Cataract extraction w/phaco 12/06/2010    Procedure: CATARACT EXTRACTION PHACO AND INTRAOCULAR LENS PLACEMENT (IOC);  Surgeon: [name] [name];  Location: [facility];  Service: Ophthalmology;  Laterality: Left;  CDE:10.81  . Cataract extraction w/phaco 12/20/2010    Procedure: CATARACT EXTRACTION PHACO AND INTRAOCULAR LENS PLACEMENT (IOC);  Surgeon: [name] [name];  Location: [facility];  Service: Ophthalmology;  Laterality: Right;  CDI:7.94    Family History  Problem Relation Age of Onset  . Dementia Father   . Hypertension Mother     Social History [name] [name] reports that she quit smoking about 4 weeks ago. Her  smoking use included Cigarettes. She has a 15 pack-year smoking history. She does not have any smokeless tobacco history on file. [name] [name] reports that she does not drink alcohol.  Review of Systems No palpitations. Difficulty sleeping at night due to leg pain. No orthopnea. No syncope. Stable appetite. No bleeding episodes.Otherwise as outlined above.  Physical Examination Filed Vitals:   10/06/11 0831  BP: 166/82  Pulse: 53   Filed Weights   10/06/11 0831  Weight: 144 lb (65.318 kg)   No acute distress. HEENT: Conjunctiva and lids normal, oropharynx clear. Neck: Supple, no elevated JVP, soft right carotid bruit, no thyromegaly. Lungs: Diminished but clear to auscultation, nonlabored breathing at rest. Cardiac: Regular rate and rhythm with ectopy, no S3, soft systolic murmur, no pericardial rub. Abdomen:  Soft, nontender, bowel sounds present, no guarding or rebound. Extremities: No pitting edema, mild stasis, distal pulses 1+. Skin: Warm and dry. Musculoskeletal: Mild kyphosis. Neuropsychiatric: Alert and oriented x3, affect grossly appropriate.\   Problem List and Plan   Coronary atherosclerosis of native coronary artery Symptomatically stable on medical therapy. Records reviewed. ECG reviewed today. No change to current regimen. Had negative stress test last year. Followup arranged.  Atrial fibrillation Postoperative with last lung surgery. Will follow for now - in sinus rhythm. We did discuss thromboembolic risk, CHADS2 of 2. She prefers to stay on ASA.  Mixed hyperlipidemia Continue statin therapy. Due for labwork soon, can review lipids when obtained. Goal LDL should be close to 70.  Essential hypertension, benign Blood pressure elevated today. She checks it at home and states systolic usually in 120-130 range. I asked her to keep log for her next visit. Could have element of white coat hypertension.    [name] [name], M.D., F.A.C.C.

## 2011-11-06 ENCOUNTER — Ambulatory Visit (HOSPITAL_COMMUNITY)
Admission: RE | Admit: 2011-11-06 | Discharge: 2011-11-06 | Disposition: A | Discharge status: Home / Self Care | Payer: Medicare Other | Source: Ambulatory Visit | Attending: Cardiology | Admitting: Cardiology

## 2011-11-06 ENCOUNTER — Ambulatory Visit (HOSPITAL_COMMUNITY): Admission: RE | Admit: 2011-11-06 | Payer: Medicare Other | Source: Ambulatory Visit

## 2011-11-06 DIAGNOSIS — J449 Chronic obstructive pulmonary disease, unspecified: Secondary | ICD-10-CM | POA: Insufficient documentation

## 2011-11-06 DIAGNOSIS — I10 Essential (primary) hypertension: Secondary | ICD-10-CM | POA: Insufficient documentation

## 2011-11-06 DIAGNOSIS — J4489 Other specified chronic obstructive pulmonary disease: Secondary | ICD-10-CM | POA: Insufficient documentation

## 2011-11-06 DIAGNOSIS — I2510 Atherosclerotic heart disease of native coronary artery without angina pectoris: Secondary | ICD-10-CM | POA: Insufficient documentation

## 2011-11-06 DIAGNOSIS — I4891 Unspecified atrial fibrillation: Secondary | ICD-10-CM | POA: Insufficient documentation

## 2011-11-06 DIAGNOSIS — I319 Disease of pericardium, unspecified: Secondary | ICD-10-CM

## 2011-11-06 NOTE — Progress Notes (Signed)
*  PRELIMINARY RESULTS* Echocardiogram 2D Echocardiogram has been performed.  [name] [name] 11/06/2011, 2:16 PM

## 2011-11-07 ENCOUNTER — Telehealth: Payer: Self-pay | Admitting: *Deleted

## 2011-11-07 NOTE — Telephone Encounter (Signed)
Echocardiogram reviewed. LV function is normal. She does have a moderate sized circumferential pericardial effusion noted. Recent records from Dr. Egbert Garibaldi (Pulmonary) indicate finding of pericardial effusion by recent chest imaging and PET that are reportedly abnormal with further workup pending. Please send copy of echocardiogram to Dr. Egbert Garibaldi, who I believe may have actually ordered this. Patient should have followup limited echocardiogram to reassess pericardial effusion over the next few weeks, sooner if clinical status changes. Jonelle Sidle, M.D., F.A.C.C.   Copy of Echo and above note sent to Dr Ronnette Juniper for review

## 2011-11-23 ENCOUNTER — Encounter: Payer: Self-pay | Admitting: Cardiology

## 2012-06-20 ENCOUNTER — Ambulatory Visit (INDEPENDENT_AMBULATORY_CARE_PROVIDER_SITE_OTHER): Payer: Medicare Other | Admitting: Cardiology

## 2012-06-20 ENCOUNTER — Encounter: Payer: Self-pay | Admitting: Cardiology

## 2012-06-20 VITALS — BP 164/77 | HR 58 | Ht 62.0 in | Wt 136.0 lb

## 2012-06-20 DIAGNOSIS — I3139 Other pericardial effusion (noninflammatory): Secondary | ICD-10-CM | POA: Insufficient documentation

## 2012-06-20 DIAGNOSIS — E782 Mixed hyperlipidemia: Secondary | ICD-10-CM

## 2012-06-20 DIAGNOSIS — I313 Pericardial effusion (noninflammatory): Secondary | ICD-10-CM

## 2012-06-20 DIAGNOSIS — I2510 Atherosclerotic heart disease of native coronary artery without angina pectoris: Secondary | ICD-10-CM

## 2012-06-20 DIAGNOSIS — I319 Disease of pericardium, unspecified: Secondary | ICD-10-CM

## 2012-06-20 NOTE — Progress Notes (Signed)
Clinical Summary [name] [name] is an 79 y.o.female last seen in August 2013. She is here with her son. They updated me on interval history, she is now followed at the cancer center at [facility], has undergone radiation treatments for recurrent lung cancer, has pending brain radiation as well. She reports fatigue, no definite angina symptoms.  Echocardiogram from October 2013 revealed mild LVH with LV EF 60-65%, grade 1 diastolic dysfunction, mild mitral regurgitation with moderate left atrial enlargement, trivial tricuspid regurgitation, moderate sized circumferential pericardial effusion with evidence of organization and chronicity. She had had a PET scan prior to this also demonstrated pericardial effusion in the setting of increasing right hilar mass concerning for malignancy. We have not pursued a followup ultrasound as she had, she has been hemodynamically stable. This is likely a malignant, inflammatory effusion.  Lab work from November 2013 revealed potassium 4.8, BUN 17, crit and 1.0, AST 22, ALT 22, cholesterol 159, temperature is 152, HDL 72, LDL 57, TSH 1.3.  ECG today shows sinus bradycardia   No Known Allergies  Current Outpatient Prescriptions  Medication Sig Dispense Refill  . acetaminophen (TYLENOL) 500 MG tablet Take 500 mg by mouth every 6 (six) hours as needed. For pain       . aspirin EC 81 MG tablet Take 81 mg by mouth daily.        Marland Kitchen atorvastatin (LIPITOR) 20 MG tablet Take 10 mg by mouth daily.       . ferrous sulfate 325 (65 FE) MG tablet Take 325 mg by mouth daily with breakfast.        . hydrochlorothiazide (HYDRODIURIL) 12.5 MG tablet Take 12.5 mg by mouth daily.        Marland Kitchen levothyroxine (SYNTHROID, LEVOTHROID) 88 MCG tablet Take 75 mcg by mouth daily.       . metoprolol tartrate (LOPRESSOR) 25 MG tablet Take 12.5 mg by mouth daily.       Marland Kitchen olmesartan (BENICAR) 40 MG tablet Take 40 mg by mouth daily.        Marland Kitchen tiotropium (SPIRIVA) 18 MCG inhalation capsule Place 18 mcg into  inhaler and inhale daily.         No current facility-administered medications for this visit.    Past Medical History  Diagnosis Date  . Essential hypertension, benign   . Coronary atherosclerosis of native coronary artery     Previously followed by Dr. Earna Coder, BMS RCA 2002  . Hypothyroidism   . Anemia   . COPD (chronic obstructive pulmonary disease)   . Lung cancer     Adenocarcinoma and carcinoid tumor  . Atrial fibrillation     Perioperative  . Pulmonary nodules   . MI (myocardial infarction)     IMI with TNK then PCI 2002    Social History [name] [name] reports that she quit smoking about 9 months ago. Her smoking use included Cigarettes. She has a 15 pack-year smoking history. She does not have any smokeless tobacco history on file. [name] [name] reports that she does not drink alcohol.  Review of Systems No reported bleeding problems. No memory deficits. Stable appetite.  Physical Examination Filed Vitals:   06/20/12 0840  BP: 164/77  Pulse: 58   Filed Weights   06/20/12 0840  Weight: 136 lb (61.689 kg)    Comfortable at rest. HEENT: Conjunctiva and lids normal, oropharynx clear.  Neck: Supple, no elevated JVP, soft right carotid bruit, no thyromegaly.  Lungs: Diminished but clear to auscultation, nonlabored breathing at rest.  Cardiac: Regular rate and rhythm with ectopy, no S3, soft systolic murmur, no pericardial rub.  Extremities: No pitting edema, mild stasis, distal pulses 1+.   Musculoskeletal: Mild kyphosis.  Neuropsychiatric: Alert and oriented x3, affect grossly appropriate.   Problem List and Plan   Coronary atherosclerosis of native coronary artery Symptomatically stable on medical therapy, ECG reviewed. followup echocardiogram in October of last year showed LVEF 60-65%. No change made to current medications.  Mixed hyperlipidemia Prior lipid numbers well controlled, LDL 57. She continues on Lipitor.  Pericardial effusion Small to moderate  sized based on differing imaging modalities. Suspect this is most likely inflammatory or malignant in light of recurrent lung cancer with brain metastasis. She is followed at [facility]. Hemodynamically stable.    Jonelle Sidle, M.D., F.A.C.C.

## 2012-06-20 NOTE — Assessment & Plan Note (Signed)
Symptomatically stable on medical therapy, ECG reviewed. followup echocardiogram in October of last year showed LVEF 60-65%. No change made to current medications.

## 2012-06-20 NOTE — Patient Instructions (Addendum)
Your physician recommends that you schedule a follow-up appointment in: 6 months  Your physician recommends that you continue on your current medications as directed. Please refer to the Current Medication list given to you today.  

## 2012-06-20 NOTE — Assessment & Plan Note (Signed)
Prior lipid numbers well controlled, LDL 57. She continues on Lipitor.

## 2012-06-20 NOTE — Assessment & Plan Note (Signed)
Small to moderate sized based on differing imaging modalities. Suspect this is most likely inflammatory or malignant in light of recurrent lung cancer with brain metastasis. She is followed at [facility]. Hemodynamically stable.

## 2012-08-13 ENCOUNTER — Telehealth: Payer: Self-pay | Admitting: *Deleted

## 2012-08-13 NOTE — Telephone Encounter (Signed)
agree

## 2012-08-13 NOTE — Telephone Encounter (Signed)
Reviewed pts chart- pt has never been seen in our office or at the hospital. Tried to call pt back, [facility], asked her to go to ED to be evaluated.

## 2012-08-13 NOTE — Telephone Encounter (Signed)
Pt called stating she can't go to the bathroom, and she can't eat, pt wanted to know if she needed to come here or go to ER. Please advise [phone].

## 2012-08-15 ENCOUNTER — Telehealth: Payer: Self-pay

## 2012-08-15 NOTE — Telephone Encounter (Signed)
Pt called and told Chelsey that she had been referred here to get an appt. I returned the pt's call and saw that she had phoned here on 08/13/2012 and c/o not being able to use the bathroom and was advised to go to the ED. She said she went to PCP that day and they were sending referral over and I told her we have not received it. She said she will call them back and tell them to try sending again.

## 2012-08-15 NOTE — Telephone Encounter (Signed)
Per Chelsey, pt called back and said she called her PCP and they were going to mail the referral.

## 2012-08-16 ENCOUNTER — Encounter: Payer: Self-pay | Admitting: Gastroenterology

## 2012-08-19 ENCOUNTER — Emergency Department (HOSPITAL_COMMUNITY): Payer: Medicare Other

## 2012-08-19 ENCOUNTER — Encounter (HOSPITAL_COMMUNITY): Payer: Self-pay

## 2012-08-19 ENCOUNTER — Emergency Department (HOSPITAL_COMMUNITY)
Admission: EM | Admit: 2012-08-19 | Discharge: 2012-08-19 | Disposition: A | Discharge status: Home / Self Care | Payer: Medicare Other | Attending: Emergency Medicine | Admitting: Emergency Medicine

## 2012-08-19 DIAGNOSIS — J449 Chronic obstructive pulmonary disease, unspecified: Secondary | ICD-10-CM | POA: Insufficient documentation

## 2012-08-19 DIAGNOSIS — J4489 Other specified chronic obstructive pulmonary disease: Secondary | ICD-10-CM | POA: Insufficient documentation

## 2012-08-19 DIAGNOSIS — I313 Pericardial effusion (noninflammatory): Secondary | ICD-10-CM

## 2012-08-19 DIAGNOSIS — D649 Anemia, unspecified: Secondary | ICD-10-CM | POA: Insufficient documentation

## 2012-08-19 DIAGNOSIS — I10 Essential (primary) hypertension: Secondary | ICD-10-CM | POA: Insufficient documentation

## 2012-08-19 DIAGNOSIS — Z85841 Personal history of malignant neoplasm of brain: Secondary | ICD-10-CM | POA: Insufficient documentation

## 2012-08-19 DIAGNOSIS — I252 Old myocardial infarction: Secondary | ICD-10-CM | POA: Insufficient documentation

## 2012-08-19 DIAGNOSIS — Z79899 Other long term (current) drug therapy: Secondary | ICD-10-CM | POA: Insufficient documentation

## 2012-08-19 DIAGNOSIS — Z7982 Long term (current) use of aspirin: Secondary | ICD-10-CM | POA: Insufficient documentation

## 2012-08-19 DIAGNOSIS — R109 Unspecified abdominal pain: Secondary | ICD-10-CM | POA: Insufficient documentation

## 2012-08-19 DIAGNOSIS — Z90710 Acquired absence of both cervix and uterus: Secondary | ICD-10-CM | POA: Insufficient documentation

## 2012-08-19 DIAGNOSIS — E039 Hypothyroidism, unspecified: Secondary | ICD-10-CM | POA: Insufficient documentation

## 2012-08-19 DIAGNOSIS — I319 Disease of pericardium, unspecified: Secondary | ICD-10-CM | POA: Insufficient documentation

## 2012-08-19 DIAGNOSIS — Z923 Personal history of irradiation: Secondary | ICD-10-CM | POA: Insufficient documentation

## 2012-08-19 DIAGNOSIS — Z87898 Personal history of other specified conditions: Secondary | ICD-10-CM | POA: Insufficient documentation

## 2012-08-19 DIAGNOSIS — R300 Dysuria: Secondary | ICD-10-CM | POA: Insufficient documentation

## 2012-08-19 DIAGNOSIS — I2510 Atherosclerotic heart disease of native coronary artery without angina pectoris: Secondary | ICD-10-CM | POA: Insufficient documentation

## 2012-08-19 DIAGNOSIS — Z8709 Personal history of other diseases of the respiratory system: Secondary | ICD-10-CM | POA: Insufficient documentation

## 2012-08-19 DIAGNOSIS — C7800 Secondary malignant neoplasm of unspecified lung: Secondary | ICD-10-CM | POA: Insufficient documentation

## 2012-08-19 DIAGNOSIS — Z9089 Acquired absence of other organs: Secondary | ICD-10-CM | POA: Insufficient documentation

## 2012-08-19 DIAGNOSIS — Z87891 Personal history of nicotine dependence: Secondary | ICD-10-CM | POA: Insufficient documentation

## 2012-08-19 LAB — COMPREHENSIVE METABOLIC PANEL
Albumin: 3.6 g/dL (ref 3.5–5.2)
Alkaline Phosphatase: 68 U/L (ref 39–117)
BUN: 19 mg/dL (ref 6–23)
Calcium: 9.6 mg/dL (ref 8.4–10.5)
Creatinine, Ser: 1.03 mg/dL (ref 0.50–1.10)
GFR calc Af Amer: 57 mL/min — ABNORMAL LOW (ref >90)
Glucose, Bld: 106 mg/dL — ABNORMAL HIGH (ref 70–99)
Potassium: 4.3 mEq/L (ref 3.5–5.1)
Total Protein: 7.3 g/dL (ref 6.0–8.3)

## 2012-08-19 LAB — CBC WITH DIFFERENTIAL/PLATELET
Basophils Relative: 0 % (ref 0–1)
Eosinophils Absolute: 0 10*3/uL (ref 0.0–0.7)
Eosinophils Relative: 1 % (ref 0–5)
HCT: 31.3 % — ABNORMAL LOW (ref 36.0–46.0)
Hemoglobin: 10.5 g/dL — ABNORMAL LOW (ref 12.0–15.0)
Lymphs Abs: 1.1 10*3/uL (ref 0.7–4.0)
MCH: 31.3 pg (ref 26.0–34.0)
MCHC: 33.5 g/dL (ref 30.0–36.0)
MCV: 93.2 fL (ref 78.0–100.0)
Monocytes Absolute: 0.7 10*3/uL (ref 0.1–1.0)
Monocytes Relative: 8 % (ref 3–12)
Neutrophils Relative %: 79 % — ABNORMAL HIGH (ref 43–77)
RBC: 3.36 MIL/uL — ABNORMAL LOW (ref 3.87–5.11)

## 2012-08-19 LAB — URINALYSIS, ROUTINE W REFLEX MICROSCOPIC
Bilirubin Urine: NEGATIVE
Ketones, ur: NEGATIVE mg/dL
Nitrite: NEGATIVE
Specific Gravity, Urine: 1.02 (ref 1.005–1.030)
Urobilinogen, UA: 0.2 mg/dL (ref 0.0–1.0)
pH: 5.5 (ref 5.0–8.0)

## 2012-08-19 LAB — LIPASE, BLOOD: Lipase: 25 U/L (ref 11–59)

## 2012-08-19 LAB — URINE MICROSCOPIC-ADD ON

## 2012-08-19 MED ORDER — SODIUM CHLORIDE 0.9 % IV BOLUS (SEPSIS)
500.0000 mL | Freq: Once | INTRAVENOUS | Status: AC
  Administered 2012-08-19: 500 mL via INTRAVENOUS

## 2012-08-19 MED ORDER — IOHEXOL 300 MG/ML  SOLN
100.0000 mL | Freq: Once | INTRAMUSCULAR | Status: AC | PRN
  Administered 2012-08-19: 100 mL via INTRAVENOUS

## 2012-08-19 MED ORDER — IOHEXOL 300 MG/ML  SOLN
50.0000 mL | Freq: Once | INTRAMUSCULAR | Status: AC | PRN
  Administered 2012-08-19: 50 mL via ORAL

## 2012-08-19 MED ORDER — HYDROCODONE-ACETAMINOPHEN 5-325 MG PO TABS: 1.0000 | ORAL_TABLET | ORAL | Status: DC | PRN

## 2012-08-19 MED ORDER — MORPHINE SULFATE 2 MG/ML IJ SOLN
2.0000 mg | Freq: Once | INTRAMUSCULAR | Status: AC
  Administered 2012-08-19: 2 mg via INTRAVENOUS
  Filled 2012-08-19: qty 1

## 2012-08-19 NOTE — ED Notes (Signed)
Pt complain of abdominal pain for about three weeks. States  She thinks she has a blockage because she cannot have a bowel movement unless she takes a laxative. Also, complain of pain and pressure with urination

## 2012-08-19 NOTE — ED Provider Notes (Signed)
CSN: [id number]     Arrival date & time 08/19/12  8295 History  This chart was scribed for [name] Hutching, MD by Bennett Scrape, ED Scribe. This patient was seen in room APA07/APA07 and the patient's care was started at 9:46 AM.   Chief Complaint  Patient presents with  . Abdominal Pain    The history is provided by the patient. No language interpreter was used.   HPI Comments: [name] [name] is a 79 y.o. female who presents to the Emergency Department complaining of persistent diffuse abdominal pain for the past week. She reports an inability to pass stool without a laxative for the past several months. She states that she would go 4 to 5 days with no BM where at baseline she would have one BM daily. She has tried Doculax and Murelax intermittently with no improvement. She states that only magnesium citrate would cause bowel movements since the onset; however, she reports taking Murelax for the past 3 days with a large amount of soft stool being expelled. She states that she still felt like their was stool present and tried an enema with no improvement yesterday.  She states that she became concerned when she developed mild dysuria last night. She was seen last week by PCP for difficulty urinating and abdominal pain. Her kidney functions were normal at the time and she was given a referral to GI. Son reports that she has appointment with Dr. Kendell Bane in one week. She denies frequency, nausea and emesis as associated symptoms. She has a h/o lung, lymph nodes and brain CA. The lung CA was treated with a right upper and middle lobectomy, the lymph nodes were treated with 10 treatments of chemotherapy earlier this year and the three "spots" on the brain with one radiation treatment with remission in all three. 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 She has a CT scan of the head scheduled next month to recheck the lesions.  PCP is Dr. Verlan Friends in [place]   Past Medical  History  Diagnosis Date  . Essential hypertension, benign   . Coronary atherosclerosis of native coronary artery     Previously followed by Dr. Earna Coder, BMS RCA 2002  . Hypothyroidism   . Anemia   . COPD (chronic obstructive pulmonary disease)   . Lung cancer     Adenocarcinoma and carcinoid tumor  . Atrial fibrillation     Perioperative  . Pulmonary nodules   . MI (myocardial infarction)     IMI with TNK then PCI 2002   Past Surgical History  Procedure Laterality Date  . Abdominal hysterectomy    . Right middle lobe wedge resection      2012  . Right upper lobectomy      2008  . Back surgery      laminectomy  . Appendectomy    . Cataract extraction w/phaco  12/06/2010    Procedure: CATARACT EXTRACTION PHACO AND INTRAOCULAR LENS PLACEMENT (IOC);  Surgeon: Loraine Leriche T. Nile Riggs;  Location: [facility];  Service: Ophthalmology;  Laterality: Left;  CDE:10.81  . Cataract extraction w/phaco  12/20/2010    Procedure: CATARACT EXTRACTION PHACO AND INTRAOCULAR LENS PLACEMENT (IOC);  Surgeon: Loraine Leriche T. Nile Riggs;  Location: [facility];  Service: Ophthalmology;  Laterality: Right;  CDI:7.94   Family History  Problem Relation Age of Onset  . Dementia Father   . Hypertension Mother    History  Substance Use Topics  . Smoking status: Former Smoker --  0.25 packs/day for 60 years    Types: Cigarettes    Quit date: 09/04/2011  . Smokeless tobacco: Not on file  . Alcohol Use: No   No OB history provided.  Review of Systems  A complete 10 system review of systems was obtained and all systems are negative except as noted in the HPI and PMH.   Allergies  Review of patient's allergies indicates no known allergies.  Home Medications   Current Outpatient Rx  Name  Route  Sig  Dispense  Refill  . acetaminophen (TYLENOL) 500 MG tablet   Oral   Take 500 mg by mouth every 6 (six) hours as needed. For pain          . aspirin EC 81 MG tablet   Oral   Take 81 mg by mouth daily.           Marland Kitchen  atorvastatin (LIPITOR) 20 MG tablet   Oral   Take 10 mg by mouth daily.          . ferrous sulfate 325 (65 FE) MG tablet   Oral   Take 325 mg by mouth daily with breakfast.           . hydrochlorothiazide (HYDRODIURIL) 12.5 MG tablet   Oral   Take 12.5 mg by mouth daily.           Marland Kitchen levothyroxine (SYNTHROID, LEVOTHROID) 88 MCG tablet   Oral   Take 75 mcg by mouth daily.          . metoprolol tartrate (LOPRESSOR) 25 MG tablet   Oral   Take 12.5 mg by mouth daily.          Marland Kitchen olmesartan (BENICAR) 40 MG tablet   Oral   Take 40 mg by mouth daily.           Marland Kitchen tiotropium (SPIRIVA) 18 MCG inhalation capsule   Inhalation   Place 18 mcg into inhaler and inhale daily.            Triage Vitals: BP 175/61  Pulse 79  Temp(Src) 98.2 F (36.8 C) (Oral)  Resp 16  Ht 5\' 1"  (1.549 m)  Wt 133 lb (60.328 kg)  BMI 25.14 kg/m2  SpO2 95%  Physical Exam  Nursing note and vitals reviewed. Constitutional: She is oriented to person, place, and time. She appears well-developed and well-nourished.  HENT:  Head: Normocephalic and atraumatic.  Eyes: Conjunctivae and EOM are normal. Pupils are equal, round, and reactive to light.  Neck: Normal range of motion. Neck supple.  Cardiovascular: Normal rate, regular rhythm and normal heart sounds.   Pulmonary/Chest: Effort normal and breath sounds normal.  Abdominal: Soft. Bowel sounds are normal. There is tenderness (generalized, minimal abdominal tenderness, worse in the suprapubic region).  Musculoskeletal: Normal range of motion.  Neurological: She is alert and oriented to person, place, and time.  Skin: Skin is warm and dry.  Psychiatric: She has a normal mood and affect.    ED Course   DIAGNOSTIC STUDIES: Oxygen Saturation is 95% on room air, normal by my interpretation.    COORDINATION OF CARE: 9:56 AM-Discussed treatment plan which includes rectal exam, abdominal x-ray, CBC panel, CMP and UA with pt at bedside and pt  agreed to plan.   Procedures (including critical care time)  Labs Reviewed  CBC WITH DIFFERENTIAL - Abnormal; Notable for the following:    RBC 3.36 (*)    Hemoglobin 10.5 (*)    HCT 31.3 (*)  Neutrophils Relative % 79 (*)    All other components within normal limits  COMPREHENSIVE METABOLIC PANEL - Abnormal; Notable for the following:    Sodium 129 (*)    Chloride 93 (*)    Glucose, Bld 106 (*)    Total Bilirubin 0.2 (*)    GFR calc non Af Amer 49 (*)    GFR calc Af Amer 57 (*)    All other components within normal limits  URINALYSIS, ROUTINE W REFLEX MICROSCOPIC - Abnormal; Notable for the following:    Leukocytes, UA TRACE (*)    All other components within normal limits  LIPASE, BLOOD  URINE MICROSCOPIC-ADD ON    Ct Abdomen Pelvis W Contrast  08/19/2012   *RADIOLOGY REPORT*  Clinical Data: Generalized abdominal pain  CT ABDOMEN AND PELVIS WITH CONTRAST  Technique:  Multidetector CT imaging of the abdomen and pelvis was performed following the standard protocol during bolus administration of intravenous contrast.  Contrast: 50mL OMNIPAQUE IOHEXOL 300 MG/ML  SOLN, OMNIPAQUE IOHEXOL 300 MG/ML  SOLN  Comparison: None.  Findings: The cardiac silhouette is enlarged due to both cardiomegaly and pericardial effusion.  A moderate pericardial effusion is identified measuring approximately 1.522 cm in diameter.  There is a small chronic-appearing pleural effusion at the right lung base and there is evidence of scarring in the right lung base parenchyma.  In the left lower lobe there are two adjacent pulmonary nodules measuring two and 3 mm respectively. These are seen on image #2.  Liver, gallbladder, and spleen appear normal.  Pancreas is normal except for prominence of the common bile duct through the pancreatic head.  It measures about 8 mm, likely within normal limits for patient age.  No intrahepatic biliary dilatation noted. Adrenal glands are normal.  Kidneys are normal.  There is  a nonobstructive bowel gas pattern.  The appendix is not identified.  There is significant diverticulosis of the sigmoid colon.  There is no surrounding inflammatory change to suggest diverticulitis.  Bladder is normal.  Reproductive organs are not identified.  There are no acute musculoskeletal findings.  IMPRESSION:  1.  Chronic small right pleural effusion with scarring right lung base. 2.  Two tiny pulmonary nodules left lung base,  If the patient is at high risk for bronchogenic carcinoma, follow-up chest CT at 1 year is recommended.  If the patient is at low risk, no follow-up is needed.  This recommendation follows the consensus statement: Guidelines for Management of Small Pulmonary Nodules Detected on CT Scans:  A Statement from the Fleischner Society as published in Radiology 2005; 237:395-400. 3.  Small to moderate pericardial effusion. 4.  Diverticulosis sigmoid colon without evidence of diverticulitis.  No acute findings in the abdomen or pelvis.   Original Report Authenticated By: Esperanza Heir, M.D.   Dg Abd Acute W/chest  08/19/2012   *RADIOLOGY REPORT*  Clinical Data: Abdominal pain, constipation  ACUTE ABDOMEN SERIES (ABDOMEN 2 VIEW & CHEST 1 VIEW)  Comparison: Chest radiograph 05/21/2009  Findings: Stable enlarged cardiac and mediastinal contours with tortuosity of the thoracic aorta.  Redemonstrated postsurgical change within the right hemithorax.  Interval development of heterogeneous opacities within the right mid and lower lung.  Small right pleural effusion.  Regional skeleton is grossly unremarkable.  Gas is demonstrated within nondilated loops of large and small bowel in a nonobstructive pattern.  Extensive vascular calcifications.  Lower lumbar spine degenerative change.  IMPRESSION: 1. Interval development of heterogeneous opacities within the right mid and lower lung  which may represent pneumonia in the appropriate clinical setting.  Additionally there is a new small right  pleural  effusion.   Recommend  radiographic follow up until  resolution  as recurrent malignancy is an additional consideration. 2. Nonobstructive bowel gas pattern.   Original Report Authenticated By: Annia Belt, M.D   No results found. No diagnosis found.  MDM  No acute abdomen. Discussed results of CT scan patient and her son.. They will see a gastroenterologist next week. Prescription for Vicodin #25.  Patient has known lung cancer and has followup for same.  I personally performed the services described in this documentation, which was scribed in my presence. The recorded information has been reviewed and is accurate.    [name] Hutching, MD 08/19/12 [phone]

## 2012-08-19 NOTE — ED Notes (Signed)
Last normal BM weeks ago.  Has had to take Miralax daily, but has only had small, loose BMs 2-3 xj/day.  Also has had feeling she is not emptying bladder fully.  Last bloodwork was last Tues. At Dr. Morton Amy office and was told it was normal.

## 2012-08-19 NOTE — ED Notes (Signed)
Patient with no complaints at this time. Respirations even and unlabored. Skin warm/dry. Discharge instructions reviewed with patient at this time. Patient given opportunity to voice concerns/ask questions. IV removed per policy and band-aid applied to site. Patient discharged at this time and left Emergency Department with steady gait.  

## 2012-08-26 ENCOUNTER — Ambulatory Visit (INDEPENDENT_AMBULATORY_CARE_PROVIDER_SITE_OTHER): Payer: Medicare Other | Admitting: Gastroenterology

## 2012-08-26 ENCOUNTER — Encounter: Payer: Self-pay | Admitting: Gastroenterology

## 2012-08-26 VITALS — BP 134/64 | HR 60 | Temp 98.4°F | Ht 60.0 in | Wt 130.0 lb

## 2012-08-26 DIAGNOSIS — R109 Unspecified abdominal pain: Secondary | ICD-10-CM

## 2012-08-26 DIAGNOSIS — R05 Cough: Secondary | ICD-10-CM

## 2012-08-26 DIAGNOSIS — R194 Change in bowel habit: Secondary | ICD-10-CM | POA: Insufficient documentation

## 2012-08-26 DIAGNOSIS — R198 Other specified symptoms and signs involving the digestive system and abdomen: Secondary | ICD-10-CM

## 2012-08-26 DIAGNOSIS — R059 Cough, unspecified: Secondary | ICD-10-CM

## 2012-08-26 DIAGNOSIS — K5900 Constipation, unspecified: Secondary | ICD-10-CM

## 2012-08-26 DIAGNOSIS — R0602 Shortness of breath: Secondary | ICD-10-CM

## 2012-08-26 DIAGNOSIS — E871 Hypo-osmolality and hyponatremia: Secondary | ICD-10-CM

## 2012-08-26 MED ORDER — INULIN 1.5 G PO CHEW: 2.0000 | CHEWABLE_TABLET | Freq: Every day | ORAL | Status: AC

## 2012-08-26 MED ORDER — POLYETHYLENE GLYCOL 3350 17 GM/SCOOP PO POWD: 17.0000 g | Freq: Every evening | ORAL | Status: AC | PRN

## 2012-08-26 MED ORDER — PANTOPRAZOLE SODIUM 40 MG PO TBEC: 40.0000 mg | DELAYED_RELEASE_TABLET | Freq: Every day | ORAL | Status: AC

## 2012-08-26 NOTE — Patient Instructions (Addendum)
1. Start pantoprazole 1 capsule 30 minutes before breakfast. Prescription sent to your pharmacy. 2. Start fiber choice, chew two tablets daily. 3. MiraLax, take 1 capful at bedtime as needed for constipation. 4. Please have your blood work done. 5. Please followup with your family physician regarding worsening shortness of breath. Please take copy of your chest x-ray and CT scan. 6. Once we receive your records from Dr. Hendricks Milo office, we will call you with further recommendations. 7. Office visit in six weeks with Dr. Jena Gauss.

## 2012-08-26 NOTE — Progress Notes (Addendum)
Primary Care Physician:  Marjean Donna, MD  Primary Gastroenterologist:  Roetta Sessions, MD   Chief Complaint  Patient presents with  . Abdominal Pain  . Constipation    HPI:  [name] [name] is a 79 y.o. female here for further evaluation of abdominal pain. She was referred by loc[facility] physician for Dr. Marjean Donna. Referral  Paper stated patient had a mass in the abdomen but I cannot find any indication of this. Patient's past medical history significant for recurrent lung cancer. She describes having 2 surgeries initially in 2008 and again in 2012 [facility]. She states she require radiation therapy to the lung earlier this year. Most recently had radiation for brain lesions. Radiation and followup has been at [facility]. She reports having chronic constipation. Worse since 06/2012. Tried Citracel, MagCitrate, Miralax. Incomplete evacuation. Now on Ensure, has daily BM, but still with abdominal pain and incomplete evacuation. PP mid-abdominal pain and into lower abdomen. Rectal pressure all the time. Having difficulty urinating. Trying to drink lots of water, but reports that she drinks less than 3-4 glasses a day. She describes having light yellow urine however. No brbpr, no melena. Some occasional nausea. No vomiting. Recent heartburn, belching. No dysphagia.   Per patient, last colonoscopy, Dr. Samuella Cota, colon polyps few years ago. No EGD.   She went to emergent department on August 11 because of abdominal pain, difficulty passing urine and stool. Labs and CT scan as below. Also chest x-ray as below.  Last week or so, worsening breathing. Chronic oxygen at night but feels SOB. Pressure in chest.   Current Outpatient Prescriptions  Medication Sig Dispense Refill  . acetaminophen (TYLENOL) 500 MG tablet Take 500 mg by mouth 2 (two) times daily as needed for pain. For pain      . aspirin EC 81 MG tablet Take 81 mg by mouth daily.        Marland Kitchen atorvastatin (LIPITOR) 20 MG tablet Take 10 mg by mouth  daily.       . ferrous sulfate 325 (65 FE) MG tablet Take 325 mg by mouth daily with breakfast.      . hydrochlorothiazide (HYDRODIURIL) 12.5 MG tablet Take 12.5 mg by mouth daily.        Marland Kitchen levothyroxine (SYNTHROID, LEVOTHROID) 75 MCG tablet Take 75 mcg by mouth daily before breakfast.      . metoprolol tartrate (LOPRESSOR) 25 MG tablet Take 12.5 mg by mouth daily.       Marland Kitchen olmesartan (BENICAR) 40 MG tablet Take 40 mg by mouth daily.        Marland Kitchen tiotropium (SPIRIVA) 18 MCG inhalation capsule Place 18 mcg into inhaler and inhale daily.         No current facility-administered medications for this visit.    Allergies as of 08/26/2012  . (No Known Allergies)    Past Medical History  Diagnosis Date  . Essential hypertension, benign   . Coronary atherosclerosis of native coronary artery     Previously followed by Dr. Earna Coder, BMS RCA 2002  . Hypothyroidism   . Anemia   . COPD (chronic obstructive pulmonary disease)   . Lung cancer     Adenocarcinoma and carcinoid tumor. XRT in 02/2012. also with brain lesion s/p XRT. at [facility].  Marland Kitchen Atrial fibrillation     Perioperative  . Pulmonary nodules   . MI (myocardial infarction)     IMI with TNK then PCI 2002    Past Surgical History  Procedure Laterality Date  . Abdominal  hysterectomy    . Right middle lobe wedge resection      2012  . Right upper lobectomy      2008  . Back surgery      laminectomy  . Appendectomy    . Cataract extraction w/phaco  12/06/2010    Procedure: CATARACT EXTRACTION PHACO AND INTRAOCULAR LENS PLACEMENT (IOC);  Surgeon: Loraine Leriche T. Nile Riggs;  Location: [facility];  Service: Ophthalmology;  Laterality: Left;  CDE:10.81  . Cataract extraction w/phaco  12/20/2010    Procedure: CATARACT EXTRACTION PHACO AND INTRAOCULAR LENS PLACEMENT (IOC);  Surgeon: Loraine Leriche T. Nile Riggs;  Location: [facility];  Service: Ophthalmology;  Laterality: Right;  CDI:7.94    Family History  Problem Relation Age of Onset  . Dementia Father   . Hypertension  Mother   . Ovarian cancer Sister     deceased age 68,     History   Social History  . Marital Status: Widowed    Spouse Name: N/A    Number of Children: 1  . Years of Education: N/A   Occupational History  .     Social History Main Topics  . Smoking status: Former Smoker -- 0.25 packs/day for 60 years    Types: Cigarettes    Quit date: 09/04/2011  . Smokeless tobacco: Not on file  . Alcohol Use: No  . Drug Use: No  . Sexual Activity: Not on file   Other Topics Concern  . Not on file   Social History Narrative  . No narrative on file      ROS:  General: Negative for anorexia, weight loss, fever, chills. Complains of fatigue, weakness. Eyes: Negative for vision changes.  ENT: Negative for hoarseness, difficulty swallowing , nasal congestion. CV: Negative for chest pain, angina, palpitations, peripheral edema. Complains of dyspnea on exertion Respiratory: Negative for dyspnea at rest, cough, sputum, wheezing. Complains of dyspnea on exertion GI: See history of present illness. GU:  Negative for dysuria, hematuria, urinary incontinence, urinary frequency, nocturnal urination. Complains of urinary hesitancy at times. MS: Negative for joint pain, low back pain.  Derm: Negative for rash or itching.  Neuro: Negative for weakness, abnormal sensation, seizure, frequent headaches, memory loss, confusion.  Psych: Negative for anxiety, depression, suicidal ideation, hallucinations.  Endo: Negative for unusual weight change.  Heme: Negative for bruising or bleeding. Allergy: Negative for rash or hives.    Physical Examination:  BP 134/64  Pulse 60  Temp(Src) 98.4 F (36.9 C) (Oral)  Ht 5' (1.524 m)  Wt 130 lb (58.968 kg)  BMI 25.39 kg/m2   General: Well-nourished, well-developed in no acute distress. Accompanied by son and daughter-in-law. Head: Normocephalic, atraumatic.   Eyes: Conjunctiva pink, no icterus. Mouth: Oropharyngeal mucosa moist and pink , no lesions  erythema or exudate. Neck: Supple without thyromegaly, masses, or lymphadenopathy.  Lungs: Clear to auscultation bilaterally.  Heart: Regular rate and rhythm, no murmurs rubs or gallops.  Abdomen: Bowel sounds are normal, diffuse tenderness with palpation more in the entire right abdomen compared to the left, nondistended, no hepatosplenomegaly or masses, no abdominal bruits or    hernia , no rebound or guarding.   Rectal: Hemorrhoids noted externally. Digital rectal exam nontender. No stool present. Secretions heme negative. No masses seen. No anal stenosis. Good anorectal tone. Extremities: No lower extremity edema. No clubbing or deformities.  Neuro: Alert and oriented x 4 , grossly normal neurologically.  Skin: Warm and dry, no rash or jaundice.   Psych: Alert and cooperative, normal mood  and affect.  Labs: Lab Results  Component Value Date   WBC 8.6 08/19/2012   HGB 10.5* 08/19/2012   HCT 31.3* 08/19/2012   MCV 93.2 08/19/2012   PLT 290 08/19/2012   Lab Results  Component Value Date   CREATININE 1.03 08/19/2012   BUN 19 08/19/2012   NA 129* 08/19/2012   K 4.3 08/19/2012   CL 93* 08/19/2012   CO2 28 08/19/2012   Lab Results  Component Value Date   ALT 7 08/19/2012   AST 13 08/19/2012   ALKPHOS 68 08/19/2012   BILITOT 0.2* 08/19/2012   Lab Results  Component Value Date   LIPASE 25 08/19/2012   April 2014 hemoglobin 10.9, hematocrit 33.4 TSH 4.41  Imaging Studies: Ct Abdomen Pelvis W Contrast  08/19/2012   *RADIOLOGY REPORT*  Clinical Data: Generalized abdominal pain  CT ABDOMEN AND PELVIS WITH CONTRAST  Technique:  Multidetector CT imaging of the abdomen and pelvis was performed following the standard protocol during bolus administration of intravenous contrast.  Contrast: 50mL OMNIPAQUE IOHEXOL 300 MG/ML  SOLN, OMNIPAQUE IOHEXOL 300 MG/ML  SOLN  Comparison: None.  Findings: The cardiac silhouette is enlarged due to both cardiomegaly and pericardial effusion.  A moderate  pericardial effusion is identified measuring approximately 1.522 cm in diameter.  There is a small chronic-appearing pleural effusion at the right lung base and there is evidence of scarring in the right lung base parenchyma.  In the left lower lobe there are two adjacent pulmonary nodules measuring two and 3 mm respectively. These are seen on image #2.  Liver, gallbladder, and spleen appear normal.  Pancreas is normal except for prominence of the common bile duct through the pancreatic head.  It measures about 8 mm, likely within normal limits for patient age.  No intrahepatic biliary dilatation noted. Adrenal glands are normal.  Kidneys are normal.  There is a nonobstructive bowel gas pattern.  The appendix is not identified.  There is significant diverticulosis of the sigmoid colon.  There is no surrounding inflammatory change to suggest diverticulitis.  Bladder is normal.  Reproductive organs are not identified.  There are no acute musculoskeletal findings.  IMPRESSION:  1.  Chronic small right pleural effusion with scarring right lung base. 2.  Two tiny pulmonary nodules left lung base,  If the patient is at high risk for bronchogenic carcinoma, follow-up chest CT at 1 year is recommended.  If the patient is at low risk, no follow-up is needed.  This recommendation follows the consensus statement: Guidelines for Management of Small Pulmonary Nodules Detected on CT Scans:  A Statement from the Fleischner Society as published in Radiology 2005; 237:395-400. 3.  Small to moderate pericardial effusion. 4.  Diverticulosis sigmoid colon without evidence of diverticulitis.  No acute findings in the abdomen or pelvis.   Original Report Authenticated By: Esperanza Heir, M.D.   Dg Abd Acute W/chest  08/19/2012   *RADIOLOGY REPORT*  Clinical Data: Abdominal pain, constipation  ACUTE ABDOMEN SERIES (ABDOMEN 2 VIEW & CHEST 1 VIEW)  Comparison: Chest radiograph 05/21/2009  Findings: Stable enlarged cardiac and mediastinal  contours with tortuosity of the thoracic aorta.  Redemonstrated postsurgical change within the right hemithorax.  Interval development of heterogeneous opacities within the right mid and lower lung.  Small right pleural effusion.  Regional skeleton is grossly unremarkable.  Gas is demonstrated within nondilated loops of large and small bowel in a nonobstructive pattern.  Extensive vascular calcifications.  Lower lumbar spine degenerative change.  IMPRESSION: 1.  Interval development of heterogeneous opacities within the right mid and lower lung which may represent pneumonia in the appropriate clinical setting.  Additionally there is a new small right  pleural effusion.   Recommend  radiographic follow up until  resolution  as recurrent malignancy is an additional consideration. 2. Nonobstructive bowel gas pattern.   Original Report Authenticated By: Annia Belt, M.D

## 2012-08-26 NOTE — Assessment & Plan Note (Signed)
79 year old lady who presents for further evaluation of acute on chronic constipation, abdominal pain. Reports having constipation for years but worse since June. Cannot have a bowel movement when she take something. Has tried Citrucel, MiraLax but feels like incomplete stooling. Has taken magnesium citrate with decent results. Last colonoscopy several years ago per patient. States she had colon polyps. Also complains of difficulty with urinary hesitancy. Stable anemia. LFTs, lipase unremarkable. Recent CT scan done through the emergency department showed mildly dilated common bile duct at the pancreatic head but otherwise unremarkable. Pancreas appeared normal otherwise. She has quite diffuse abdominal pain on exam, more in the right upper abdomen. Describes new onset heartburn.  History of recurrent lung cancer, questionable history of adenocarcinoma and carcinoid per EPIC PMH. Patient not able to provide details for this. Last treatment XRT several months ago. Next follow-up in 10/2012 at [facility] for scans. Scans look okay for her abdomen is noted. She has chronic pleural effusion and pericardial effusion as noted. One-week history of worsening shortness of breath. Discussed at length with patient, daughter-in-law, son. Provided them with copies of her CT and chest x-ray report and have requested that she see her PCP since we do not have prior data on her. If she gets worsening shortness of breath, fever, chest pain, worsening abdominal pain, blood in the stool she should go to the emergency department. In the meantime we have requested records of prior colonoscopy. I have requested further records from her PCP regarding? Abdominal mass which I believe is simply a clerical error. We will recheck her met 7 due to hyponatremia. She may need an upper endoscopy and/or colonoscopy at some point in the near future but we will wait on records. In the meantime she will add fiber choice 2 chewable tablets daily. MiraLax 17  g at bedtime daily. Add pantoprazole 40 mg daily to see if this will help with her postprandial abdominal pain, heartburn.  Office visit in 6 weeks with Dr. Jena Gauss. Again she may have an endoscopy/colonoscopy in the interim based on records. Further recommendations to follow.

## 2012-08-26 NOTE — Progress Notes (Signed)
CC'd to PCP 

## 2012-08-27 LAB — BASIC METABOLIC PANEL
CO2: 29 mEq/L (ref 19–32)
Calcium: 10.3 mg/dL (ref 8.4–10.5)
Creat: 0.98 mg/dL (ref 0.50–1.10)
Glucose, Bld: 94 mg/dL (ref 70–99)

## 2012-08-29 ENCOUNTER — Telehealth: Payer: Self-pay | Admitting: *Deleted

## 2012-08-29 NOTE — Telephone Encounter (Signed)
If abdominal pain is worsening, she needs to be seen in the ED.

## 2012-08-29 NOTE — Telephone Encounter (Signed)
I called pt and she said she is doing all the things, taking all of the meds that she was told to do at the OV. She is still having abdominal pain and she rates it a 7-8 now. She has had a little watery BM x 4 today and has a lot of gas. She wanted to know if Verlon Au has gotten lab results and spoken to Dr. Jena Gauss. Please advise!

## 2012-08-29 NOTE — Telephone Encounter (Signed)
Called and informed pt.  

## 2012-08-29 NOTE — Telephone Encounter (Signed)
Pt called stating medication isn't helping. Please advise [phone]

## 2012-08-29 NOTE — Telephone Encounter (Signed)
[name] [name] has gone for the day, spoke to [name] [name] and she said ok to route to her.

## 2012-09-02 ENCOUNTER — Telehealth: Payer: Self-pay | Admitting: *Deleted

## 2012-09-02 ENCOUNTER — Encounter: Payer: Self-pay | Admitting: Gastroenterology

## 2012-09-02 NOTE — Telephone Encounter (Signed)
Reviewed last colonoscopy report. Done 2011, Dr. Samuella Cota. Couple of polyps. Her sodium remains low but stable. Please find out how her BMs are. Is she on both fiber, Miralax?

## 2012-09-02 NOTE — Telephone Encounter (Signed)
Pt's son called stated that pt is still having stomach pain, please advise [phone]

## 2012-09-03 LAB — CBC
Granulocytes:: 8.6
HCT: 33 %
HGB: 10.9 g/dL
platelet count: 324

## 2012-09-03 LAB — COMPREHENSIVE METABOLIC PANEL: Albumin: 3.9

## 2012-09-03 LAB — BASIC METABOLIC PANEL: Sodium: 132 mmol/L — AB (ref 137–147)

## 2012-09-03 NOTE — Telephone Encounter (Signed)
Spoke with pts sonBrett Canales, pts BM's are soft and she is having them frequently. No blood. No N/V, no fever. He stated she is still having abd pain and he wants to know if there is anything else they need to do or any other tests? Please advise.

## 2012-09-03 NOTE — Telephone Encounter (Signed)
Spoke with pts son, see separate phone note.

## 2012-09-04 ENCOUNTER — Other Ambulatory Visit: Payer: Self-pay | Admitting: Internal Medicine

## 2012-09-04 DIAGNOSIS — R12 Heartburn: Secondary | ICD-10-CM

## 2012-09-04 DIAGNOSIS — R194 Change in bowel habit: Secondary | ICD-10-CM

## 2012-09-04 NOTE — Telephone Encounter (Signed)
EGD scheduled with [facility] on Tuesday Sept 2 at 10:30, I have mailed her instructions and she is aware

## 2012-09-04 NOTE — Telephone Encounter (Signed)
Please let patient and/or patient's son noted that I discussed her case with Dr. Jena Gauss. He recommends proceeding with an upper endoscopy for further evaluation of new heartburn and abdominal pain. At this time he does not feel a colonoscopy is necessary given she had one 3 years ago by Dr. Samuella Cota.   Please schedule upper endoscopy with Dr. Jena Gauss. Keep office visit with Dr. Jena Gauss as scheduled.

## 2012-09-04 NOTE — Telephone Encounter (Signed)
Tried to call pts son- NA

## 2012-09-04 NOTE — Telephone Encounter (Signed)
Benedetto Goad, please schedule egd asap

## 2012-09-04 NOTE — Telephone Encounter (Signed)
Discussed with patient. She is agreeable to EGD. Please schedule ASAP. Patient mentioned pain postprandial and upper abd. GB looked ok on CT.

## 2012-09-05 ENCOUNTER — Encounter (HOSPITAL_COMMUNITY): Payer: Self-pay | Admitting: Pharmacy Technician

## 2012-09-10 ENCOUNTER — Encounter (HOSPITAL_COMMUNITY): Payer: Self-pay

## 2012-09-10 ENCOUNTER — Encounter (HOSPITAL_COMMUNITY)
Admission: RE | Disposition: A | Discharge status: Home / Self Care | Payer: Self-pay | Source: Ambulatory Visit | Attending: Internal Medicine

## 2012-09-10 ENCOUNTER — Ambulatory Visit (HOSPITAL_COMMUNITY)
Admission: RE | Admit: 2012-09-10 | Discharge: 2012-09-10 | Disposition: A | Discharge status: Home / Self Care | Payer: Medicare Other | Source: Ambulatory Visit | Attending: Internal Medicine | Admitting: Internal Medicine

## 2012-09-10 DIAGNOSIS — R1013 Epigastric pain: Secondary | ICD-10-CM

## 2012-09-10 DIAGNOSIS — J4489 Other specified chronic obstructive pulmonary disease: Secondary | ICD-10-CM | POA: Insufficient documentation

## 2012-09-10 DIAGNOSIS — K3189 Other diseases of stomach and duodenum: Secondary | ICD-10-CM | POA: Insufficient documentation

## 2012-09-10 DIAGNOSIS — J449 Chronic obstructive pulmonary disease, unspecified: Secondary | ICD-10-CM | POA: Insufficient documentation

## 2012-09-10 DIAGNOSIS — K449 Diaphragmatic hernia without obstruction or gangrene: Secondary | ICD-10-CM | POA: Insufficient documentation

## 2012-09-10 DIAGNOSIS — Z79899 Other long term (current) drug therapy: Secondary | ICD-10-CM | POA: Insufficient documentation

## 2012-09-10 DIAGNOSIS — Z01812 Encounter for preprocedural laboratory examination: Secondary | ICD-10-CM | POA: Insufficient documentation

## 2012-09-10 DIAGNOSIS — R194 Change in bowel habit: Secondary | ICD-10-CM

## 2012-09-10 DIAGNOSIS — K219 Gastro-esophageal reflux disease without esophagitis: Secondary | ICD-10-CM

## 2012-09-10 DIAGNOSIS — R933 Abnormal findings on diagnostic imaging of other parts of digestive tract: Secondary | ICD-10-CM

## 2012-09-10 DIAGNOSIS — R12 Heartburn: Secondary | ICD-10-CM

## 2012-09-10 DIAGNOSIS — K319 Disease of stomach and duodenum, unspecified: Secondary | ICD-10-CM | POA: Insufficient documentation

## 2012-09-10 DIAGNOSIS — I10 Essential (primary) hypertension: Secondary | ICD-10-CM | POA: Insufficient documentation

## 2012-09-10 HISTORY — PX: ESOPHAGOGASTRODUODENOSCOPY: SHX5428

## 2012-09-10 LAB — GLUCOSE, CAPILLARY: Glucose-Capillary: 105 mg/dL — ABNORMAL HIGH (ref 70–99)

## 2012-09-10 SURGERY — EGD (ESOPHAGOGASTRODUODENOSCOPY)
Anesthesia: Moderate Sedation

## 2012-09-10 MED ORDER — MIDAZOLAM HCL 5 MG/5ML IJ SOLN
INTRAMUSCULAR | Status: DC | PRN
  Administered 2012-09-10 (×3): 1 mg via INTRAVENOUS

## 2012-09-10 MED ORDER — SODIUM CHLORIDE 0.9 % IV SOLN
INTRAVENOUS | Status: DC
  Administered 2012-09-10: 10:00:00 via INTRAVENOUS

## 2012-09-10 MED ORDER — MEPERIDINE HCL 100 MG/ML IJ SOLN
INTRAMUSCULAR | Status: AC
  Filled 2012-09-10: qty 2

## 2012-09-10 MED ORDER — ONDANSETRON HCL 4 MG/2ML IJ SOLN
INTRAMUSCULAR | Status: AC
  Filled 2012-09-10: qty 2

## 2012-09-10 MED ORDER — MIDAZOLAM HCL 5 MG/5ML IJ SOLN
INTRAMUSCULAR | Status: AC
  Filled 2012-09-10: qty 10

## 2012-09-10 MED ORDER — ONDANSETRON HCL 4 MG/2ML IJ SOLN
INTRAMUSCULAR | Status: DC | PRN
  Administered 2012-09-10: 4 mg via INTRAVENOUS

## 2012-09-10 MED ORDER — STERILE WATER FOR IRRIGATION IR SOLN
Status: DC | PRN
  Administered 2012-09-10: 11:00:00

## 2012-09-10 MED ORDER — BUTAMBEN-TETRACAINE-BENZOCAINE 2-2-14 % EX AERO
INHALATION_SPRAY | CUTANEOUS | Status: DC | PRN
  Administered 2012-09-10: 2 via TOPICAL

## 2012-09-10 MED ORDER — MEPERIDINE HCL 100 MG/ML IJ SOLN
INTRAMUSCULAR | Status: DC | PRN
  Administered 2012-09-10 (×3): 25 mg via INTRAVENOUS

## 2012-09-10 NOTE — Interval H&P Note (Signed)
History and Physical Interval Note:  09/10/2012 10:52 AM  [name] [name]  has presented today for surgery, with the diagnosis of New Onset Heartburn and bowel habit changes  The various methods of treatment have been discussed with the patient and family. After consideration of risks, benefits and other options for treatment, the patient has consented to  Procedure(s) with comments: ESOPHAGOGASTRODUODENOSCOPY (EGD) (N/A) - 10:30 as a surgical intervention .  The patient's history has been reviewed, patient examined, no change in status, stable for surgery.  I have reviewed the patient's chart and labs.  Questions were answered to the patient's satisfaction.    No dysphagia per patient. Diagnostic EGD today per plan.The risks, benefits, limitations, alternatives and imponderables have been reviewed with the patient. Potential for esophageal dilation, biopsy, etc. have also been reviewed.  Questions have been answered. All parties agreeable.   Eula Listen

## 2012-09-10 NOTE — Op Note (Signed)
[facility] [address] [place], [address]   ENDOSCOPY PROCEDURE REPORT  PATIENT: [name] [name], [name] [name]  MR#: [id number] BIRTHDATE: [birth year] , 85  yrs. old GENDER: Female ENDOSCOPIST: R.  Roetta Sessions, MD FACP [facility] REFERRED BY:      Marjean Donna, MD. PROCEDURE DATE:  09/10/2012 PROCEDURE:     EGD with gastric biopsy  INDICATIONS:     Worsening dyspepsia; GERD  INFORMED CONSENT:   The risks, benefits, limitations, alternatives and imponderables have been discussed.  The potential for biopsy, esophogeal dilation, etc. have also been reviewed.  Questions have been answered.  All parties agreeable.  Please see the history and physical in the medical record for more information.  MEDICATIONS:    Versed 3 mg IV and Demerol 75 mg IV in divided doses. Cetacaine spray. Zofran 4 mg IV  DESCRIPTION OF PROCEDURE:   The XB-[id number] ([id number])  endoscope was introduced through the mouth and advanced to the second portion of the duodenum without difficulty or limitations.  The mucosal surfaces were surveyed very carefully during advancement of the scope and upon withdrawal.  Retroflexion view of the proximal stomach and esophagogastric junction was performed.      FINDINGS: Normal, patent tubular esophagus. Stomach with quite a bit of bile-stained gastric mucus. Patient had linear antral erosions present. Small hiatal hernia. No ulcer or infiltrating process. Patent pylorus. Normal first and second portion of the duodenum  THERAPEUTIC / DIAGNOSTIC MANEUVERS PERFORMED:  Biopsies of the abnormal gastric mucosa taken for histologic study   COMPLICATIONS:  None  IMPRESSION:  Small hiatal hernia. Abnormal gastric mucosa of uncertain significance-status post biopsy  RECOMMENDATIONS:  Follow up on pathology. Further recommendations to follow.    _______________________________ R. Roetta Sessions, MD FACP [facility] eSigned:  R. Roetta Sessions, MD FACP [facility] 09/10/2012 11:21  AM     CC:

## 2012-09-10 NOTE — H&P (View-Only) (Signed)
Primary Care Physician:  HICKSON,WILLIAM, MD  Primary Gastroenterologist:  Michael Rourk, MD   Chief Complaint  Patient presents with  . Abdominal Pain  . Constipation    HPI:  [name] [name] is a 79 y.o. female here for further evaluation of abdominal pain. She was referred by locum tenems physician for Dr. William Hickson. Referral  Paper stated patient had a mass in the abdomen but I cannot find any indication of this. Patient's past medical history significant for recurrent lung cancer. She describes having 2 surgeries initially in 2008 and again in 2012 [facility]. She states she require radiation therapy to the lung earlier this year. Most recently had radiation for brain lesions. Radiation and followup has been at [facility]. She reports having chronic constipation. Worse since 06/2012. Tried Citracel, MagCitrate, Miralax. Incomplete evacuation. Now on Ensure, has daily BM, but still with abdominal pain and incomplete evacuation. PP mid-abdominal pain and into lower abdomen. Rectal pressure all the time. Having difficulty urinating. Trying to drink lots of water, but reports that she drinks less than 3-4 glasses a day. She describes having light yellow urine however. No brbpr, no melena. Some occasional nausea. No vomiting. Recent heartburn, belching. No dysphagia.   Per patient, last colonoscopy, Dr. Pandya, colon polyps few years ago. No EGD.   She went to emergent department on August 11 because of abdominal pain, difficulty passing urine and stool. Labs and CT scan as below. Also chest x-ray as below.  Last week or so, worsening breathing. Chronic oxygen at night but feels SOB. Pressure in chest.   Current Outpatient Prescriptions  Medication Sig Dispense Refill  . acetaminophen (TYLENOL) 500 MG tablet Take 500 mg by mouth 2 (two) times daily as needed for pain. For pain      . aspirin EC 81 MG tablet Take 81 mg by mouth daily.        . atorvastatin (LIPITOR) 20 MG tablet Take 10 mg by mouth  daily.       . ferrous sulfate 325 (65 FE) MG tablet Take 325 mg by mouth daily with breakfast.      . hydrochlorothiazide (HYDRODIURIL) 12.5 MG tablet Take 12.5 mg by mouth daily.        . levothyroxine (SYNTHROID, LEVOTHROID) 75 MCG tablet Take 75 mcg by mouth daily before breakfast.      . metoprolol tartrate (LOPRESSOR) 25 MG tablet Take 12.5 mg by mouth daily.       . olmesartan (BENICAR) 40 MG tablet Take 40 mg by mouth daily.        . tiotropium (SPIRIVA) 18 MCG inhalation capsule Place 18 mcg into inhaler and inhale daily.         No current facility-administered medications for this visit.    Allergies as of 08/26/2012  . (No Known Allergies)    Past Medical History  Diagnosis Date  . Essential hypertension, benign   . Coronary atherosclerosis of native coronary artery     Previously followed by Dr. Zachary, BMS RCA 2002  . Hypothyroidism   . Anemia   . COPD (chronic obstructive pulmonary disease)   . Lung cancer     Adenocarcinoma and carcinoid tumor. XRT in 02/2012. also with brain lesion s/p XRT. at [facility].  . Atrial fibrillation     Perioperative  . Pulmonary nodules   . MI (myocardial infarction)     IMI with TNK then PCI 2002    Past Surgical History  Procedure Laterality Date  . Abdominal   hysterectomy    . Right middle lobe wedge resection      2012  . Right upper lobectomy      2008  . Back surgery      laminectomy  . Appendectomy    . Cataract extraction w/phaco  12/06/2010    Procedure: CATARACT EXTRACTION PHACO AND INTRAOCULAR LENS PLACEMENT (IOC);  Surgeon: Mark T. Shapiro;  Location: [facility];  Service: Ophthalmology;  Laterality: Left;  CDE:10.81  . Cataract extraction w/phaco  12/20/2010    Procedure: CATARACT EXTRACTION PHACO AND INTRAOCULAR LENS PLACEMENT (IOC);  Surgeon: Mark T. Shapiro;  Location: [facility];  Service: Ophthalmology;  Laterality: Right;  CDI:7.94    Family History  Problem Relation Age of Onset  . Dementia Father   . Hypertension  Mother   . Ovarian cancer Sister     deceased age 29,     History   Social History  . Marital Status: Widowed    Spouse Name: N/A    Number of Children: 1  . Years of Education: N/A   Occupational History  .     Social History Main Topics  . Smoking status: Former Smoker -- 0.25 packs/day for 60 years    Types: Cigarettes    Quit date: 09/04/2011  . Smokeless tobacco: Not on file  . Alcohol Use: No  . Drug Use: No  . Sexual Activity: Not on file   Other Topics Concern  . Not on file   Social History Narrative  . No narrative on file      ROS:  General: Negative for anorexia, weight loss, fever, chills. Complains of fatigue, weakness. Eyes: Negative for vision changes.  ENT: Negative for hoarseness, difficulty swallowing , nasal congestion. CV: Negative for chest pain, angina, palpitations, peripheral edema. Complains of dyspnea on exertion Respiratory: Negative for dyspnea at rest, cough, sputum, wheezing. Complains of dyspnea on exertion GI: See history of present illness. GU:  Negative for dysuria, hematuria, urinary incontinence, urinary frequency, nocturnal urination. Complains of urinary hesitancy at times. MS: Negative for joint pain, low back pain.  Derm: Negative for rash or itching.  Neuro: Negative for weakness, abnormal sensation, seizure, frequent headaches, memory loss, confusion.  Psych: Negative for anxiety, depression, suicidal ideation, hallucinations.  Endo: Negative for unusual weight change.  Heme: Negative for bruising or bleeding. Allergy: Negative for rash or hives.    Physical Examination:  BP 134/64  Pulse 60  Temp(Src) 98.4 F (36.9 C) (Oral)  Ht 5' (1.524 m)  Wt 130 lb (58.968 kg)  BMI 25.39 kg/m2   General: Well-nourished, well-developed in no acute distress. Accompanied by son and daughter-in-law. Head: Normocephalic, atraumatic.   Eyes: Conjunctiva pink, no icterus. Mouth: Oropharyngeal mucosa moist and pink , no lesions  erythema or exudate. Neck: Supple without thyromegaly, masses, or lymphadenopathy.  Lungs: Clear to auscultation bilaterally.  Heart: Regular rate and rhythm, no murmurs rubs or gallops.  Abdomen: Bowel sounds are normal, diffuse tenderness with palpation more in the entire right abdomen compared to the left, nondistended, no hepatosplenomegaly or masses, no abdominal bruits or    hernia , no rebound or guarding.   Rectal: Hemorrhoids noted externally. Digital rectal exam nontender. No stool present. Secretions heme negative. No masses seen. No anal stenosis. Good anorectal tone. Extremities: No lower extremity edema. No clubbing or deformities.  Neuro: Alert and oriented x 4 , grossly normal neurologically.  Skin: Warm and dry, no rash or jaundice.   Psych: Alert and cooperative, normal mood   and affect.  Labs: Lab Results  Component Value Date   WBC 8.6 08/19/2012   HGB 10.5* 08/19/2012   HCT 31.3* 08/19/2012   MCV 93.2 08/19/2012   PLT 290 08/19/2012   Lab Results  Component Value Date   CREATININE 1.03 08/19/2012   BUN 19 08/19/2012   NA 129* 08/19/2012   K 4.3 08/19/2012   CL 93* 08/19/2012   CO2 28 08/19/2012   Lab Results  Component Value Date   ALT 7 08/19/2012   AST 13 08/19/2012   ALKPHOS 68 08/19/2012   BILITOT 0.2* 08/19/2012   Lab Results  Component Value Date   LIPASE 25 08/19/2012   April 2014 hemoglobin 10.9, hematocrit 33.4 TSH 4.41  Imaging Studies: Ct Abdomen Pelvis W Contrast  08/19/2012   *RADIOLOGY REPORT*  Clinical Data: Generalized abdominal pain  CT ABDOMEN AND PELVIS WITH CONTRAST  Technique:  Multidetector CT imaging of the abdomen and pelvis was performed following the standard protocol during bolus administration of intravenous contrast.  Contrast: 50mL OMNIPAQUE IOHEXOL 300 MG/ML  SOLN, 100mL OMNIPAQUE IOHEXOL 300 MG/ML  SOLN  Comparison: None.  Findings: The cardiac silhouette is enlarged due to both cardiomegaly and pericardial effusion.  A moderate  pericardial effusion is identified measuring approximately 1.522 cm in diameter.  There is a small chronic-appearing pleural effusion at the right lung base and there is evidence of scarring in the right lung base parenchyma.  In the left lower lobe there are two adjacent pulmonary nodules measuring two and 3 mm respectively. These are seen on image #2.  Liver, gallbladder, and spleen appear normal.  Pancreas is normal except for prominence of the common bile duct through the pancreatic head.  It measures about 8 mm, likely within normal limits for patient age.  No intrahepatic biliary dilatation noted. Adrenal glands are normal.  Kidneys are normal.  There is a nonobstructive bowel gas pattern.  The appendix is not identified.  There is significant diverticulosis of the sigmoid colon.  There is no surrounding inflammatory change to suggest diverticulitis.  Bladder is normal.  Reproductive organs are not identified.  There are no acute musculoskeletal findings.  IMPRESSION:  1.  Chronic small right pleural effusion with scarring right lung base. 2.  Two tiny pulmonary nodules left lung base,  If the patient is at high risk for bronchogenic carcinoma, follow-up chest CT at 1 year is recommended.  If the patient is at low risk, no follow-up is needed.  This recommendation follows the consensus statement: Guidelines for Management of Small Pulmonary Nodules Detected on CT Scans:  A Statement from the Fleischner Society as published in Radiology 2005; 237:395-400. 3.  Small to moderate pericardial effusion. 4.  Diverticulosis sigmoid colon without evidence of diverticulitis.  No acute findings in the abdomen or pelvis.   Original Report Authenticated By: Raymond Rubner, M.D.   Dg Abd Acute W/chest  08/19/2012   *RADIOLOGY REPORT*  Clinical Data: Abdominal pain, constipation  ACUTE ABDOMEN SERIES (ABDOMEN 2 VIEW & CHEST 1 VIEW)  Comparison: Chest radiograph 05/21/2009  Findings: Stable enlarged cardiac and mediastinal  contours with tortuosity of the thoracic aorta.  Redemonstrated postsurgical change within the right hemithorax.  Interval development of heterogeneous opacities within the right mid and lower lung.  Small right pleural effusion.  Regional skeleton is grossly unremarkable.  Gas is demonstrated within nondilated loops of large and small bowel in a nonobstructive pattern.  Extensive vascular calcifications.  Lower lumbar spine degenerative change.  IMPRESSION: 1.   Interval development of heterogeneous opacities within the right mid and lower lung which may represent pneumonia in the appropriate clinical setting.  Additionally there is a new small right  pleural effusion.   Recommend  radiographic follow up until  resolution  as recurrent malignancy is an additional consideration. 2. Nonobstructive bowel gas pattern.   Original Report Authenticated By: DREW DAVIS, M.D      

## 2012-09-12 ENCOUNTER — Encounter (HOSPITAL_COMMUNITY): Payer: Self-pay | Admitting: Internal Medicine

## 2012-09-13 ENCOUNTER — Encounter: Payer: Self-pay | Admitting: Internal Medicine

## 2012-09-13 ENCOUNTER — Telehealth: Payer: Self-pay | Admitting: Internal Medicine

## 2012-09-13 NOTE — Telephone Encounter (Signed)
I called pt's son's phone to check on her and convey bx results; my message left was for the son to call back 9/8 to get bx results; need to see how she is doing and go from there(see letter)

## 2012-09-16 NOTE — Telephone Encounter (Signed)
Pt aware and samples are at the front desk.  Darl Pikes, please schedule pt ov. Chelsey- please get tcs report.

## 2012-09-16 NOTE — Telephone Encounter (Signed)
Pt is aware of OV on 10/2 at 130 with AS

## 2012-09-16 NOTE — Telephone Encounter (Signed)
And Linzess 145 mcg capsule daily to regimen x2 weeks (spls OK). Need last colonoscopy report on chart. Office visit with extender in 2 weeks

## 2012-09-16 NOTE — Telephone Encounter (Signed)
Pt called back and was given the results. She said she is still feeling terrible. She has no appetite and her bowels do not seem to be emptying completely. She is taking the miralax daily and still has no relief. She is drinking ensure and boost to try to help with the abd pain and its not helping. She wants to know if you have any further recommendations.

## 2012-09-17 NOTE — Telephone Encounter (Signed)
Requested TCS

## 2012-10-08 ENCOUNTER — Encounter: Payer: Self-pay | Admitting: Internal Medicine

## 2012-10-10 ENCOUNTER — Encounter: Payer: Self-pay | Admitting: Gastroenterology

## 2012-10-10 ENCOUNTER — Other Ambulatory Visit: Payer: Self-pay | Admitting: Gastroenterology

## 2012-10-10 ENCOUNTER — Ambulatory Visit (INDEPENDENT_AMBULATORY_CARE_PROVIDER_SITE_OTHER): Admitting: Gastroenterology

## 2012-10-10 ENCOUNTER — Other Ambulatory Visit (HOSPITAL_COMMUNITY): Payer: Medicare Other

## 2012-10-10 VITALS — BP 150/74 | HR 67 | Temp 97.6°F | Ht 60.0 in | Wt 128.0 lb

## 2012-10-10 DIAGNOSIS — R109 Unspecified abdominal pain: Secondary | ICD-10-CM | POA: Insufficient documentation

## 2012-10-10 DIAGNOSIS — K5900 Constipation, unspecified: Secondary | ICD-10-CM

## 2012-10-10 MED ORDER — LUBIPROSTONE 24 MCG PO CAPS: 24.0000 ug | ORAL_CAPSULE | Freq: Two times a day (BID) | ORAL | Status: AC

## 2012-10-10 NOTE — Assessment & Plan Note (Signed)
Change from Linzess to Amitiza 24 mcg daily, increase to BID as needed. No colonoscopy now; no rectal bleeding. Even if colorectal cancer was found, this would not change the management of patient as she is already dealing with a progressive, terminal disease. Patient currently with hospice.

## 2012-10-10 NOTE — Progress Notes (Signed)
Referring Provider: Marjean Donna, MD Primary Care Physician:  Marjean Donna, MD Primary GI: Dr. Jena Gauss   Chief Complaint  Patient presents with  . Follow-up    HPI:  Very pleasant 79 year old female presents today in follow-up after EGD, which was performed due to abdominal pain. Overall findings benign. No colonoscopy performed due to fairly recent TCS in 2011.  Also notable worsening constipation. Her son is present with her today. She has a history of stage IV lung cancer, diagnosed in early 2014. She is seen at [facility]. Last visit 9/22 and received news that her recent MRI of the brain noted multiple new supra and infratentorial parenchymal lesions. The decision has been made to pursue hospice due to her age and inability to tolerate further therapeutic intervention.   Continues to note constant abdominal pain. Won't have a bowel movement unless takes Miralax at night and Linzess 145 mcg each morning.  Then, once it starts, goes every minutes. Liquid. Diffuse abdominal discomfort. Reported as constant, always aware. Linzess 145 mcg seems too much.  A lot of times hurts after eating. Feels like she has to go to the bathroom all the time (urinate). No nausea. Poor appetite. No rectal bleeding. Symptoms have gotten worse in the past 6 months. States she has not dealt with constipation in the past.   Patient and son want me to be aware of her diagnosis and are asking for assistance with quality of life, knowing her condition is terminal.    Sept 2013: 144 Aug 2014: 133 Oct 2014: 128   Past Medical History  Diagnosis Date  . Essential hypertension, benign   . Coronary atherosclerosis of native coronary artery     Previously followed by Dr. Earna Coder, BMS RCA 2002  . Hypothyroidism   . Anemia   . COPD (chronic obstructive pulmonary disease)   . Lung cancer     Adenocarcinoma and carcinoid tumor. XRT in 02/2012. also with brain lesion s/p XRT. at [facility] As of Oct 2014,  patient has declined further treatment due to progression of brain lesions. Hospice.   . Atrial fibrillation     Perioperative  . Pulmonary nodules   . MI (myocardial infarction)     IMI with TNK then PCI 2002    Past Surgical History  Procedure Laterality Date  . Abdominal hysterectomy    . Right middle lobe wedge resection      2012, [facility]  . Right upper lobectomy      2008, [facility]  . Back surgery      laminectomy  . Appendectomy    . Cataract extraction w/phaco  12/06/2010    Procedure: CATARACT EXTRACTION PHACO AND INTRAOCULAR LENS PLACEMENT (IOC);  Surgeon: Loraine Leriche T. Nile Riggs;  Location: [facility];  Service: Ophthalmology;  Laterality: Left;  CDE:10.81  . Cataract extraction w/phaco  12/20/2010    Procedure: CATARACT EXTRACTION PHACO AND INTRAOCULAR LENS PLACEMENT (IOC);  Surgeon: Loraine Leriche T. Nile Riggs;  Location: [facility];  Service: Ophthalmology;  Laterality: Right;  CDI:7.94  . Coronary angioplasty with stent placement    . Colonoscopy  09/2009    Dr. Pandya:colon polyp rectum (adenomatous). prox ascending colon polyps, benign. internal hemorrhoids.   . Esophagogastroduodenoscopy N/A 09/10/2012    GNF:AOZHY hiatal hernia. Abnormal gastric mucosa of uncertain significance-status post biopsy with benign path    Current Outpatient Prescriptions  Medication Sig Dispense Refill  . acetaminophen (TYLENOL) 500 MG tablet Take 500 mg by mouth 2 (two) times daily as needed for pain. For  pain      . aspirin EC 81 MG tablet Take 81 mg by mouth daily.        Marland Kitchen atorvastatin (LIPITOR) 20 MG tablet Take 10 mg by mouth daily.       . ferrous sulfate 325 (65 FE) MG tablet Take 325 mg by mouth daily with breakfast.      . hydrochlorothiazide (HYDRODIURIL) 12.5 MG tablet Take 12.5 mg by mouth daily.        . Inulin (FIBER CHOICE) 1.5 G CHEW Chew 2 tablets by mouth daily.      Marland Kitchen levothyroxine (SYNTHROID, LEVOTHROID) 75 MCG tablet Take 75 mcg by mouth daily before breakfast.      . Linaclotide (LINZESS) 145 MCG  CAPS capsule Take 145 mcg by mouth daily.      . metoprolol tartrate (LOPRESSOR) 25 MG tablet Take 12.5 mg by mouth daily.       Marland Kitchen olmesartan (BENICAR) 40 MG tablet Take 40 mg by mouth daily.        . pantoprazole (PROTONIX) 40 MG tablet Take 1 tablet (40 mg total) by mouth daily.  30 tablet  3  . polyethylene glycol powder (GLYCOLAX/MIRALAX) powder Take 17 g by mouth at bedtime as needed.  527 g  3  . tiotropium (SPIRIVA) 18 MCG inhalation capsule Place 18 mcg into inhaler and inhale daily.        Marland Kitchen lubiprostone (AMITIZA) 24 MCG capsule Take 1 capsule (24 mcg total) by mouth 2 (two) times daily with a meal.  60 capsule  3   No current facility-administered medications for this visit.    Allergies as of 10/10/2012  . (No Known Allergies)    Family History  Problem Relation Age of Onset  . Dementia Father   . Hypertension Mother   . Ovarian cancer Sister     deceased age 21,     History   Social History  . Marital Status: Widowed    Spouse Name: N/A    Number of Children: 1  . Years of Education: N/A   Occupational History  .     Social History Main Topics  . Smoking status: Former Smoker -- 0.25 packs/day for 60 years    Types: Cigarettes    Quit date: 09/04/2011  . Smokeless tobacco: None  . Alcohol Use: No  . Drug Use: No  . Sexual Activity: None   Other Topics Concern  . None   Social History Narrative  . None    Review of Systems: Gen: see HPI CV: Denies chest pain, palpitations, syncope, peripheral edema, and claudication. Resp:+DOE GI: see HPI Psych: Denies depression, anxiety, memory loss, confusion. No homicidal or suicidal ideation.  Heme: Denies bruising, bleeding, and enlarged lymph nodes.  Physical Exam: BP 150/74  Pulse 67  Temp(Src) 97.6 F (36.4 C) (Oral)  Ht 5' (1.524 m)  Wt 128 lb (58.06 kg)  BMI 25 kg/m2 General:   Alert and oriented. No distress noted. Pleasant and cooperative.  Head:  Normocephalic and atraumatic. Eyes:   Conjuctiva clear without scleral icterus. Mouth:  Oral mucosa pink and moist. Good dentition. No lesions. Heart:  S1, S2 present with slight systolic murmur Abdomen:  +BS, soft, tender with palpation diffusely but moreso periumbilically and lower abdomen. No rebound or guarding  Msk:  Kyphosis Extremities:  Without edema. Neurologic:  Alert and  oriented x4;  grossly normal neurologically. Skin:  Intact without significant lesions or rashes. Psych:  Alert and cooperative. Normal mood  and affect.

## 2012-10-10 NOTE — Patient Instructions (Addendum)
Stop taking Linzess.  I have provided samples of Amitiza capsules. This is for constipation. Take 1 capsule each morning with your breakfast. Do not take on an empty stomach, as this could cause nausea. I have also sent the prescription to the pharmacy. You can increase this to twice a day if needed.   I have ordered a more detailed imaging of your abdomen to look at the vessels in your stomach and see if any are too narrow. If they are, we will refer you to [facility] to see one of the Interventional Radiologists.   Further recommendations once scan is complete.

## 2012-10-10 NOTE — Assessment & Plan Note (Signed)
79 year old female with worsening constipation and abdominal pain over the past 6 months, with no significant improvement taking Linzess 145 mcg daily. She tends to have multiple episodes of bowel movements with Linzess and feels it is too much for her. I feel she would do better on a trial of Amitiza 24 mcg once daily, with the goal of increasing to twice a day if needed.   I am more concerned the possibility of mesenteric ischemia, as she has pain with eating, documented weight loss, and known risk factors. Upon review of CT from Aug 2014 with Dr. Tyron Russell, it appears there is narrowing at both the origins of the SMA and celiac; IMA appears patent. It is unclear the extent of narrowing, but her presentation raises the concern for chronic mesenteric ischemia.   With her poor prognosis and recent news of progressive metastatic disease, I agree with less invasive approach from a GI standpoint such as an updated colonoscopy. However, she does need a CT angiogram to definitively sort out possible mesenteric ischemia. If this is a convincing result, the plan is to refer to Interventional Radiology for possible stent placement. This would aid in her overall quality of life.

## 2012-10-11 ENCOUNTER — Ambulatory Visit (INDEPENDENT_AMBULATORY_CARE_PROVIDER_SITE_OTHER): Payer: Medicare Other | Admitting: Cardiology

## 2012-10-11 ENCOUNTER — Encounter: Payer: Self-pay | Admitting: Cardiology

## 2012-10-11 VITALS — BP 148/81 | HR 72 | Ht 60.0 in | Wt 127.2 lb

## 2012-10-11 DIAGNOSIS — I313 Pericardial effusion (noninflammatory): Secondary | ICD-10-CM

## 2012-10-11 DIAGNOSIS — I2510 Atherosclerotic heart disease of native coronary artery without angina pectoris: Secondary | ICD-10-CM

## 2012-10-11 DIAGNOSIS — I319 Disease of pericardium, unspecified: Secondary | ICD-10-CM

## 2012-10-11 DIAGNOSIS — I4891 Unspecified atrial fibrillation: Secondary | ICD-10-CM

## 2012-10-11 MED ORDER — METOPROLOL TARTRATE 25 MG PO TABS: 12.5000 mg | ORAL_TABLET | Freq: Two times a day (BID) | ORAL | Status: AC

## 2012-10-11 NOTE — Assessment & Plan Note (Signed)
Small to moderate circumferential effusion based on imaging studies over the last few months, likely inflammatory or malignant.

## 2012-10-11 NOTE — Patient Instructions (Addendum)
Your physician recommends that you schedule a follow-up appointment in: 3 months  Your physician has recommended you make the following change in your medication:   1) INCREASE YOUR METOPROLOL TO 12.5MG  (HALF TABLET) TWICE DAILY

## 2012-10-11 NOTE — Progress Notes (Signed)
Clinical Summary [name] [name] is an 79 y.o.female last seen in June of this year. Records reviewed. She is followed at the [facility] with history of stage IV lung cancer, recent findings of progressive brain metastasis with plan to pursue Hospice care. GI followup note from yesterday also reviewed.  She is here with her son today. She states that she is to start with home Hospice nursing twice a week. Reports trouble with sleep, chronic abdominal pain and appetite. Also a feeling of forceful heartbeats and some pressure in the mornings. I note that she is taking Lopressor only once a day. Her ECG shows sinus rhythm with PACs, low voltage.  We discussed her overall condition. Plan to continue medical treatment for her CAD and history of arrhythmia.  No Known Allergies  Current Outpatient Prescriptions  Medication Sig Dispense Refill  . acetaminophen (TYLENOL) 325 MG tablet Take by mouth. Take 650 mg by mouth every 4 (four) hours as needed.      Marland Kitchen albuterol (PROAIR HFA) 108 (90 BASE) MCG/ACT inhaler Inhale into the lungs. Inhale 2 inhalations into the lungs every 6 (six) hours as needed.      Marland Kitchen aspirin EC 81 MG tablet Take by mouth. Take 81 mg by mouth daily.      Marland Kitchen atorvastatin (LIPITOR) 10 MG tablet Take by mouth. Take 10 mg by mouth every evening.      . ferrous sulfate 325 (65 FE) MG tablet Take 325 mg by mouth daily with breakfast.      . hydrochlorothiazide (MICROZIDE) 12.5 MG capsule Take by mouth. Take 12.5 mg by mouth daily.      Marland Kitchen HYDROcodone-acetaminophen (NORCO/VICODIN) 5-325 MG per tablet       . Inulin (FIBER CHOICE) 1.5 G CHEW Chew 2 tablets by mouth daily.      Marland Kitchen levothyroxine (SYNTHROID, LEVOTHROID) 88 MCG tablet Take by mouth. Take 75 mcg by mouth daily. Take on an empty stomach with a glass of water at least 30-60 minutes before breakfast.      . lubiprostone (AMITIZA) 24 MCG capsule Take 1 capsule (24 mcg total) by mouth 2 (two) times daily with a meal.  60 capsule   3  . metoprolol tartrate (LOPRESSOR) 25 MG tablet Take 0.5 tablets (12.5 mg total) by mouth 2 (two) times daily. Take 12.5 mg by mouth daily.  60 tablet  5  . olmesartan (BENICAR) 40 MG tablet Take by mouth. Take 40 mg by mouth daily.      . pantoprazole (PROTONIX) 40 MG tablet Take 1 tablet (40 mg total) by mouth daily.  30 tablet  3  . polyethylene glycol powder (GLYCOLAX/MIRALAX) powder Take 17 g by mouth at bedtime as needed.  527 g  3  . tiotropium (SPIRIVA) 18 MCG inhalation capsule Place into inhaler and inhale. Place 18 mcg into inhaler and inhale daily.       No current facility-administered medications for this visit.    Past Medical History  Diagnosis Date  . Essential hypertension, benign   . Coronary atherosclerosis of native coronary artery     Previously followed by Dr. Earna [name], BMS RCA 2002  . Hypothyroidism   . Anemia   . COPD (chronic obstructive pulmonary disease)   . Lung cancer     Adenocarcinoma and carcinoid tumor. XRT in 02/2012. also with brain lesion s/p XRT. at [facility]. As of Oct 2014, patient has declined further treatment due to progression of brain lesions. Hospice.   Marland Kitchen  Atrial fibrillation     Perioperative  . Pulmonary nodules   . MI (myocardial infarction)     IMI with TNK then PCI 2002    Social History Ms. [name] [name] reports that she quit smoking about 13 months ago. Her smoking use included Cigarettes. She has a 15 pack-year smoking history. She does not have any smokeless tobacco history on file. Ms. [name] [name] reports that she does not drink alcohol.  Review of Systems Negative except as outlined above.  Physical Examination Filed Vitals:   10/11/12 0823  BP: 148/81  Pulse: 72   Filed Weights   10/11/12 0823  Weight: 127 lb 4 oz (57.72 kg)    Comfortable at rest. In wheelchair. HEENT: Conjunctiva and lids normal, oropharynx clear.  Neck: Supple, no elevated JVP, soft right carotid bruit, no thyromegaly.  Lungs: Diminished but clear to  auscultation, nonlabored breathing at rest.  Cardiac: Regular rate and rhythm with ectopy, no S3, soft systolic murmur, no pericardial rub.  Extremities: No pitting edema, mild stasis, distal pulses 1+.  Musculoskeletal: Mild kyphosis.  Neuropsychiatric: Alert and oriented x3, affect grossly appropriate.   Problem List and Plan   Coronary atherosclerosis of native coronary artery Continue medical therapy, increase Lopressor to 12.5 mg twice daily. Followup arranged within the next 3 months.  Atrial fibrillation She is in sinus rhythm at present with PACs.  Pericardial effusion Small to moderate circumferential effusion based on imaging studies over the last few months, likely inflammatory or malignant.    [name] [name], M.D., F.A.C.C.

## 2012-10-11 NOTE — Assessment & Plan Note (Signed)
Continue medical therapy, increase Lopressor to 12.5 mg twice daily. Followup arranged within the next 3 months.

## 2012-10-11 NOTE — Assessment & Plan Note (Signed)
She is in sinus rhythm at present with PACs.

## 2012-10-14 NOTE — Progress Notes (Signed)
CC'd to PCP 

## 2012-10-15 ENCOUNTER — Ambulatory Visit (HOSPITAL_COMMUNITY)
Admission: RE | Admit: 2012-10-15 | Discharge: 2012-10-15 | Disposition: A | Discharge status: Home / Self Care | Payer: Medicare Other | Source: Ambulatory Visit | Attending: Gastroenterology | Admitting: Gastroenterology

## 2012-10-15 DIAGNOSIS — R109 Unspecified abdominal pain: Secondary | ICD-10-CM | POA: Insufficient documentation

## 2012-10-15 DIAGNOSIS — I701 Atherosclerosis of renal artery: Secondary | ICD-10-CM | POA: Insufficient documentation

## 2012-10-15 DIAGNOSIS — K5730 Diverticulosis of large intestine without perforation or abscess without bleeding: Secondary | ICD-10-CM | POA: Insufficient documentation

## 2012-10-15 DIAGNOSIS — Z85118 Personal history of other malignant neoplasm of bronchus and lung: Secondary | ICD-10-CM | POA: Insufficient documentation

## 2012-10-15 MED ORDER — SODIUM CHLORIDE 0.9 % IJ SOLN
INTRAMUSCULAR | Status: AC
  Filled 2012-10-15: qty 500

## 2012-10-15 MED ORDER — IOHEXOL 350 MG/ML SOLN
100.0000 mL | Freq: Once | INTRAVENOUS | Status: AC | PRN
  Administered 2012-10-15: 100 mL via INTRAVENOUS

## 2012-10-16 NOTE — Progress Notes (Signed)
Quick Note:  No evidence of occlusive mesenteric ischemia. However, left renal artery is narrowed and could potentially be of significant. Recommend referral to nephrology as soon as possible; they may just monitor for now, but I want it addressed.  Known pericardial effusion and small right pleural effusion.   How is she doing with Linzess? I attempted to call but had to leave a message. ______

## 2012-10-17 ENCOUNTER — Other Ambulatory Visit: Payer: Self-pay | Admitting: Gastroenterology

## 2012-10-17 MED ORDER — HYOSCYAMINE SULFATE 0.125 MG SL SUBL: 0.1250 mg | SUBLINGUAL_TABLET | SUBLINGUAL | Status: AC | PRN

## 2012-10-17 NOTE — Progress Notes (Signed)
Quick Note:  I have sent Levsin #20 to pharmacy.  Recommend appt with Dr. Jena Gauss only in next available.   ______

## 2012-10-21 NOTE — Progress Notes (Signed)
Referral has been faxed to Dr. Kristian Covey

## 2012-10-21 NOTE — Progress Notes (Signed)
Forwarding to [facility] for Triage

## 2012-10-24 ENCOUNTER — Encounter: Payer: Self-pay | Admitting: Internal Medicine

## 2012-10-24 NOTE — Progress Notes (Signed)
Quick Note:  Needs f/u appt with RMR only. ______

## 2012-11-08 ENCOUNTER — Ambulatory Visit: Admitting: Internal Medicine

## 2013-02-09 DEATH — deceased

## 2013-05-27 ENCOUNTER — Telehealth: Payer: Self-pay

## 2013-05-27 NOTE — Telephone Encounter (Signed)
Patient past away @ Home per [name] [name]

## 2015-02-15 IMAGING — CT CT ABD-PELV W/ CM
2 of 5 series · 16 of 46 positions shown, 18 images · IV contrast (Omnipaque 300)
Comparison: None.

CLINICAL DATA: Generalized abdominal pain

CT ABDOMEN AND PELVIS WITH CONTRAST
TECHNIQUE: Multidetector CT imaging of the abdomen and pelvis was
performed following the standard protocol during bolus
administration of intravenous contrast.
Contrast: 50mL OMNIPAQUE IOHEXOL 300 MG/ML  SOLN, 100mL OMNIPAQUE
IOHEXOL 300 MG/ML  SOLN

[Series 2: abd_pel_with 5.0 b40f · axial · 0.67mm/px · z∈[-402,-62]mm · 13 of 78 slices shown, 15 images]
[im 5/78  soft-tissue]
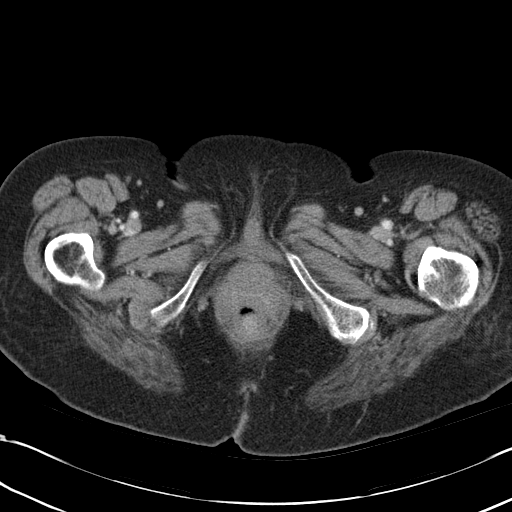
[im 5/78  bone]
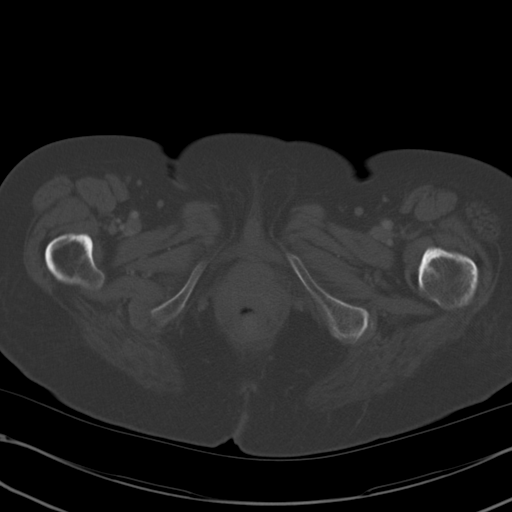
[im 10/78  soft-tissue]
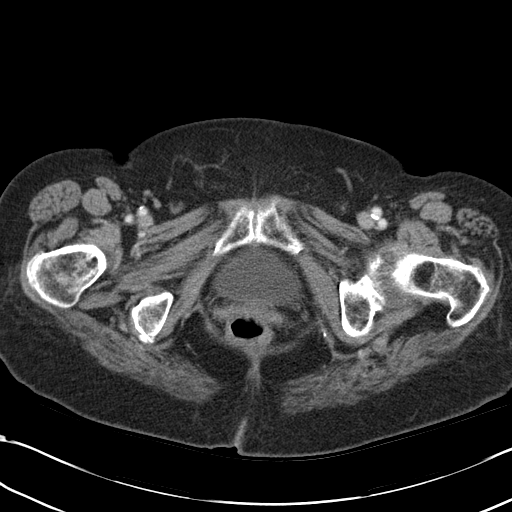
[im 15/78  soft-tissue]
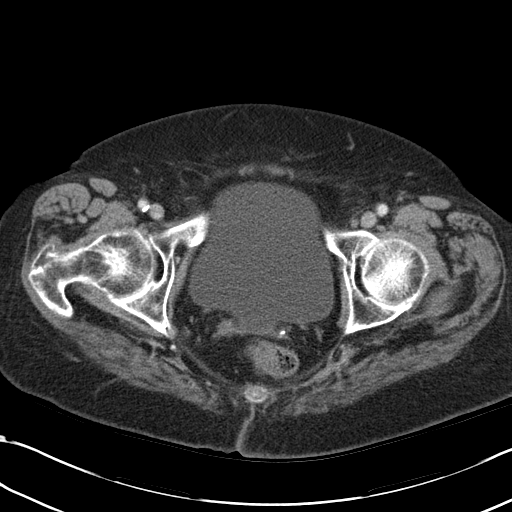
[im 25/78  soft-tissue]
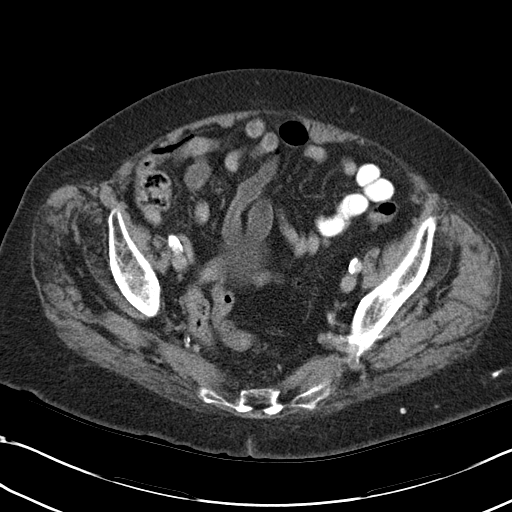
[im 29/78  soft-tissue]
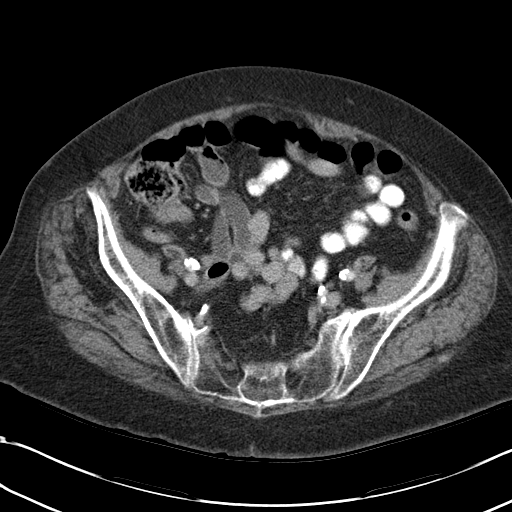
[im 34/78  soft-tissue]
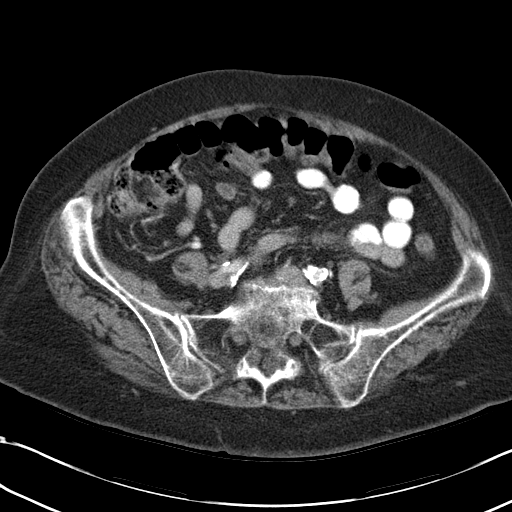
[im 39/78  soft-tissue]
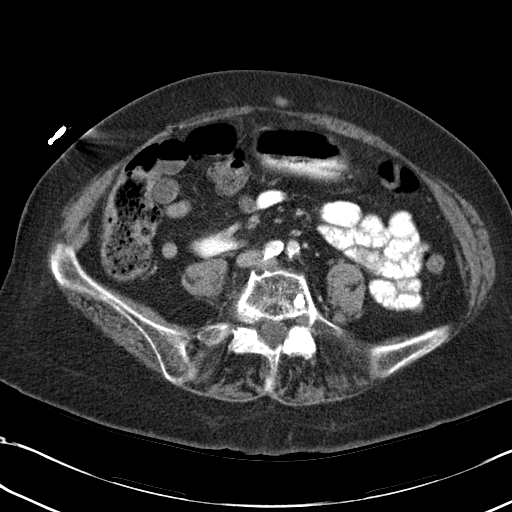
[im 44/78  soft-tissue]
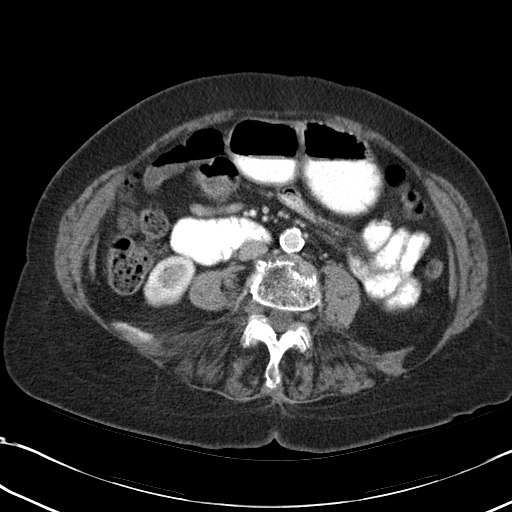
[im 49/78  soft-tissue]
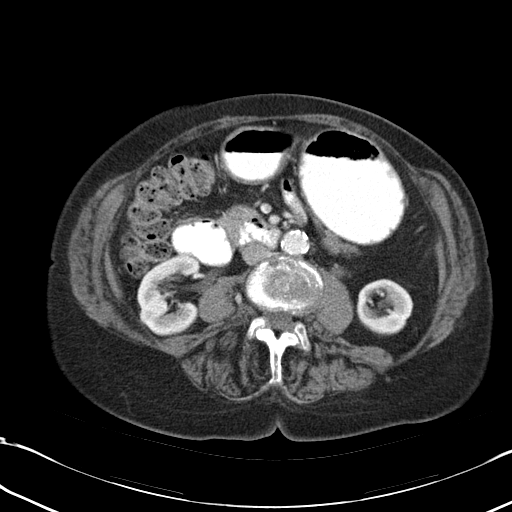
[im 49/78  bone]
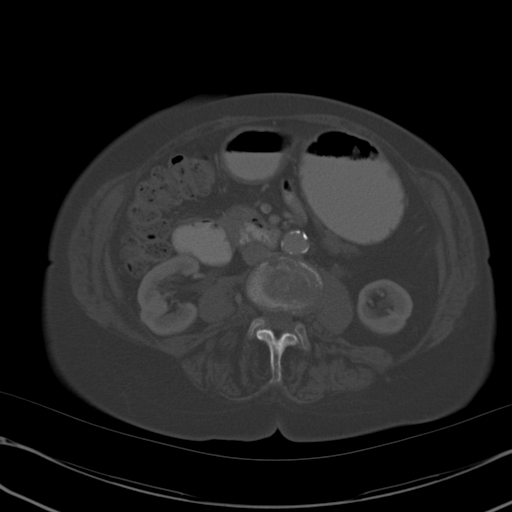
[im 53/78  soft-tissue]
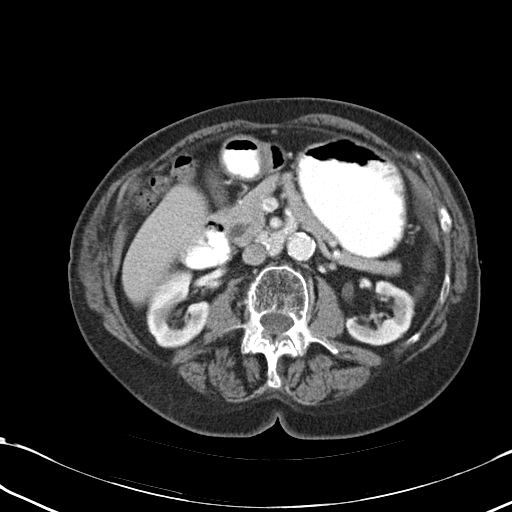
[im 63/78  soft-tissue]
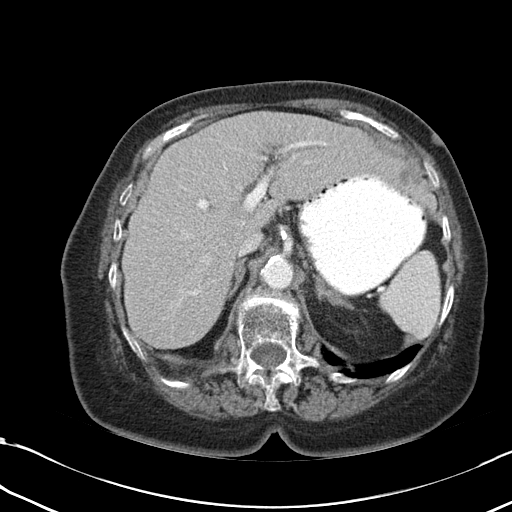
[im 68/78  soft-tissue]
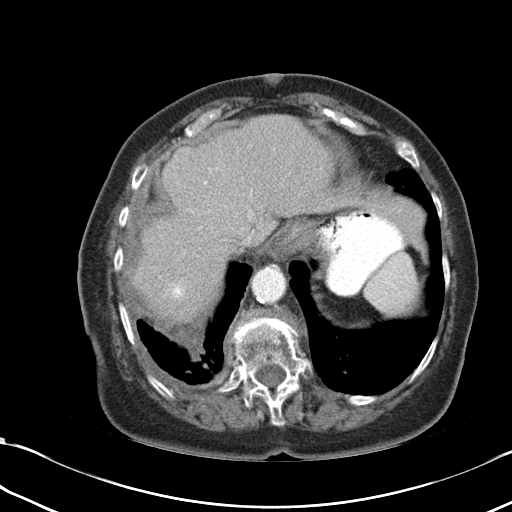
[im 73/78  soft-tissue]
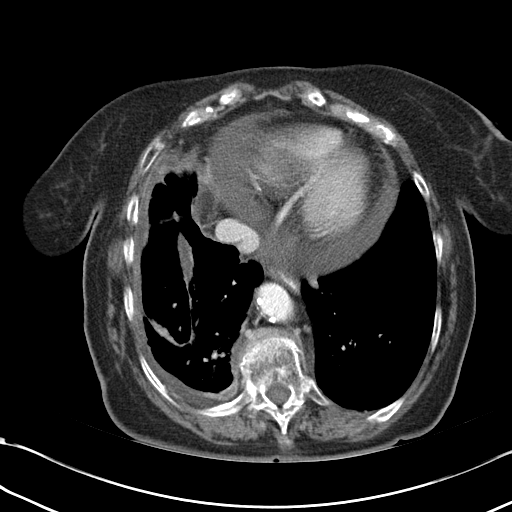

[Series 4: abd_pel_with 3.0 spo cor · coronal · 0.64mm/px · 3 of 84 slices shown]
[im 28/84  soft-tissue]
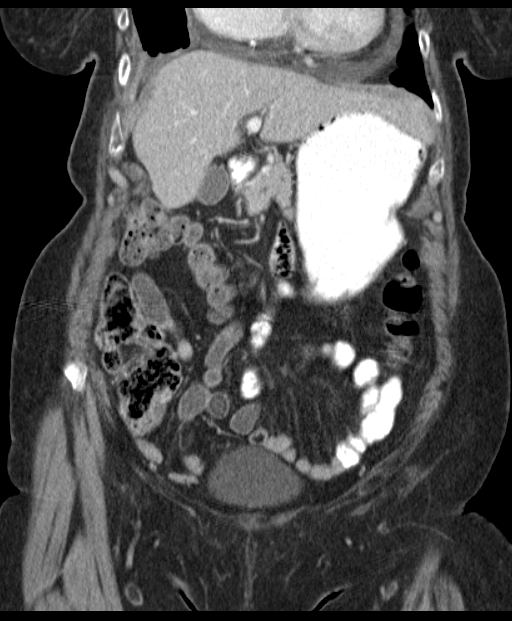
[im 37/84  soft-tissue]
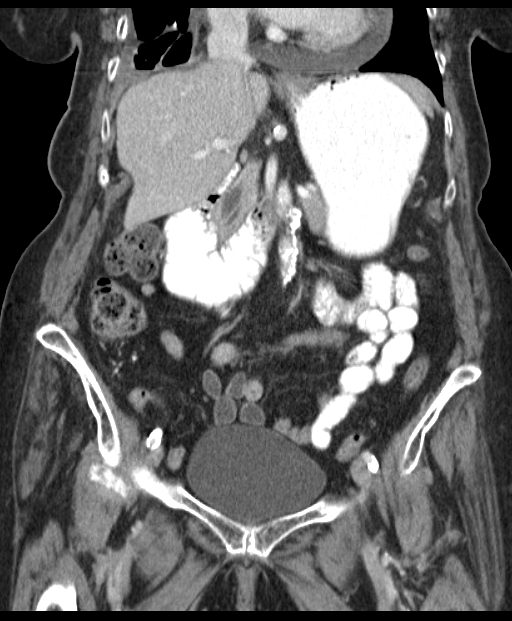
[im 47/84  soft-tissue]
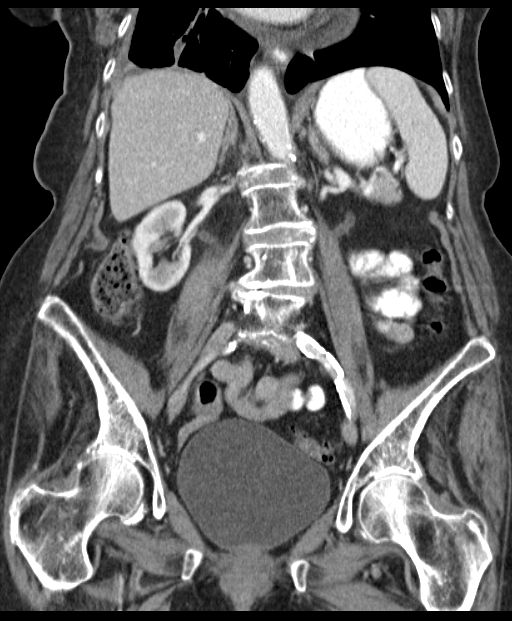

[16 of 46 positions shown; findings below may reference images not displayed]

FINDINGS: The cardiac silhouette is enlarged due to both
cardiomegaly and pericardial effusion.  A moderate pericardial
effusion is identified measuring approximately 1.522 cm in
diameter.  There is a small chronic-appearing pleural effusion at
the right lung base and there is evidence of scarring in the right
lung base parenchyma.  In the left lower lobe there are two
adjacent pulmonary nodules measuring two and 3 mm respectively.
These are seen on image #2.

Liver, gallbladder, and spleen appear normal.  Pancreas is normal
except for prominence of the common bile duct through the
pancreatic head.  It measures about 8 mm, likely within normal
limits for patient age.  No intrahepatic biliary dilatation noted.
Adrenal glands are normal.  Kidneys are normal.

There is a nonobstructive bowel gas pattern.  The appendix is not
identified.  There is significant diverticulosis of the sigmoid
colon.  There is no surrounding inflammatory change to suggest
diverticulitis.  Bladder is normal.  Reproductive organs are not
identified.

There are no acute musculoskeletal findings.
IMPRESSION: 1.  Chronic small right pleural effusion with scarring right lung
base.
2.  Two tiny pulmonary nodules left lung base,  If the patient is
at high risk for bronchogenic carcinoma, follow-up chest CT at 1
year is recommended.  If the patient is at low risk, no follow-up
is needed.  This recommendation follows the consensus statement:
Guidelines for Management of Small Pulmonary Nodules Detected on CT
Scans:  A Statement from the [HOSPITAL] as published in
3.  Small to moderate pericardial effusion.
4.  Diverticulosis sigmoid colon without evidence of
diverticulitis.  No acute findings in the abdomen or pelvis.
